# Patient Record
Sex: Female | Born: 1990 | Race: Black or African American | Hispanic: No | Marital: Married | Smoking: Never smoker
Health system: Southern US, Community
[De-identification: ages and names within clinical notes are randomized; demographics above are authoritative.]

## PROBLEM LIST (undated history)

## (undated) DIAGNOSIS — Z789 Other specified health status: Secondary | ICD-10-CM

## (undated) DIAGNOSIS — D649 Anemia, unspecified: Secondary | ICD-10-CM

## (undated) DIAGNOSIS — M1990 Unspecified osteoarthritis, unspecified site: Secondary | ICD-10-CM

## (undated) HISTORY — PX: KNEE ARTHROSCOPY W/ ACL RECONSTRUCTION: SHX1858

## (undated) HISTORY — PX: ARTHROSCOPIC REPAIR ACL: SUR80

## (undated) HISTORY — DX: Other specified health status: Z78.9

---

## 2009-07-04 ENCOUNTER — Emergency Department: Payer: Self-pay | Admitting: Emergency Medicine

## 2010-05-10 ENCOUNTER — Emergency Department: Payer: Self-pay | Admitting: Emergency Medicine

## 2012-04-13 ENCOUNTER — Inpatient Hospital Stay (HOSPITAL_COMMUNITY)
Admission: AD | Admit: 2012-04-13 | Discharge: 2012-04-13 | Disposition: A | Discharge status: Home / Self Care | Payer: Self-pay | Source: Ambulatory Visit | Attending: Family Medicine | Admitting: Family Medicine

## 2012-04-13 ENCOUNTER — Encounter (HOSPITAL_COMMUNITY): Payer: Self-pay | Admitting: Obstetrics and Gynecology

## 2012-04-13 DIAGNOSIS — Z3202 Encounter for pregnancy test, result negative: Secondary | ICD-10-CM | POA: Insufficient documentation

## 2012-04-13 LAB — URINALYSIS, ROUTINE W REFLEX MICROSCOPIC
Bilirubin Urine: NEGATIVE
Ketones, ur: NEGATIVE mg/dL
Nitrite: NEGATIVE
Protein, ur: NEGATIVE mg/dL
Urobilinogen, UA: 0.2 mg/dL (ref 0.0–1.0)

## 2012-04-13 NOTE — MAU Provider Note (Signed)
22 y.o. G0 here requesting pregnancy test. No period x 1.5 months. No pain or bleeding now.   Filed Vitals:   04/13/12 0928 04/13/12 0930  BP: 121/73   Pulse:  75  Temp: 98.2 F (36.8 C)   TempSrc: Oral   Resp: 18    Gen: well, no distress  Results for orders placed during the hospital encounter of 04/13/12 (from the past 24 hour(s))  URINALYSIS, ROUTINE W REFLEX MICROSCOPIC     Status: None   Collection Time    04/13/12  9:30 AM      Result Value Range   Color, Urine YELLOW  YELLOW   APPearance CLEAR  CLEAR   Specific Gravity, Urine 1.020  1.005 - 1.030   pH 7.5  5.0 - 8.0   Glucose, UA NEGATIVE  NEGATIVE mg/dL   Hgb urine dipstick NEGATIVE  NEGATIVE   Bilirubin Urine NEGATIVE  NEGATIVE   Ketones, ur NEGATIVE  NEGATIVE mg/dL   Protein, ur NEGATIVE  NEGATIVE mg/dL   Urobilinogen, UA 0.2  0.0 - 1.0 mg/dL   Nitrite NEGATIVE  NEGATIVE   Leukocytes, UA NEGATIVE  NEGATIVE  POCT PREGNANCY, URINE     Status: None   Collection Time    04/13/12  9:40 AM      Result Value Range   Preg Test, Ur NEGATIVE  NEGATIVE   A/P: Negative UPT Encouraged follow up in GYN clinic if no menses x 3 months

## 2012-04-13 NOTE — MAU Note (Signed)
Brenda Mcneil is here today for a pregnancy test. She is not having pain, bleeding or discharge. Her LMP was the end of January and no cycle since. She normally has regular cycles. She has taken 4 pregnancy tests at home and they were all negative.

## 2012-04-13 NOTE — MAU Provider Note (Signed)
Chart reviewed and agree with management and plan.  

## 2012-04-29 ENCOUNTER — Other Ambulatory Visit: Payer: Self-pay

## 2012-07-10 ENCOUNTER — Ambulatory Visit: Payer: Self-pay | Admitting: Primary Care

## 2012-08-08 ENCOUNTER — Ambulatory Visit: Payer: Self-pay | Admitting: Primary Care

## 2012-10-30 IMAGING — CR RIGHT THUMB 2+V
1 series · 3 of 3 positions shown · non-contrast
Comparison: none

REASON FOR EXAM: painful, shut in door
COMMENTS:

PROCEDURE:     DXR - DXR THUMB RIGHT HAND (1ST DIGIT)  - May 10, 2010  [DATE]
RESULT:     Three views of the right thumb are submitted. The bones are
adequately mineralized. I do not see evidence of an acute fracture nor
dislocation. No foreign bodies in the soft tissues are identified.

[Series 1: view not recorded · 0.17mm/px · 3 of 3 slices shown]
[im 1/3]
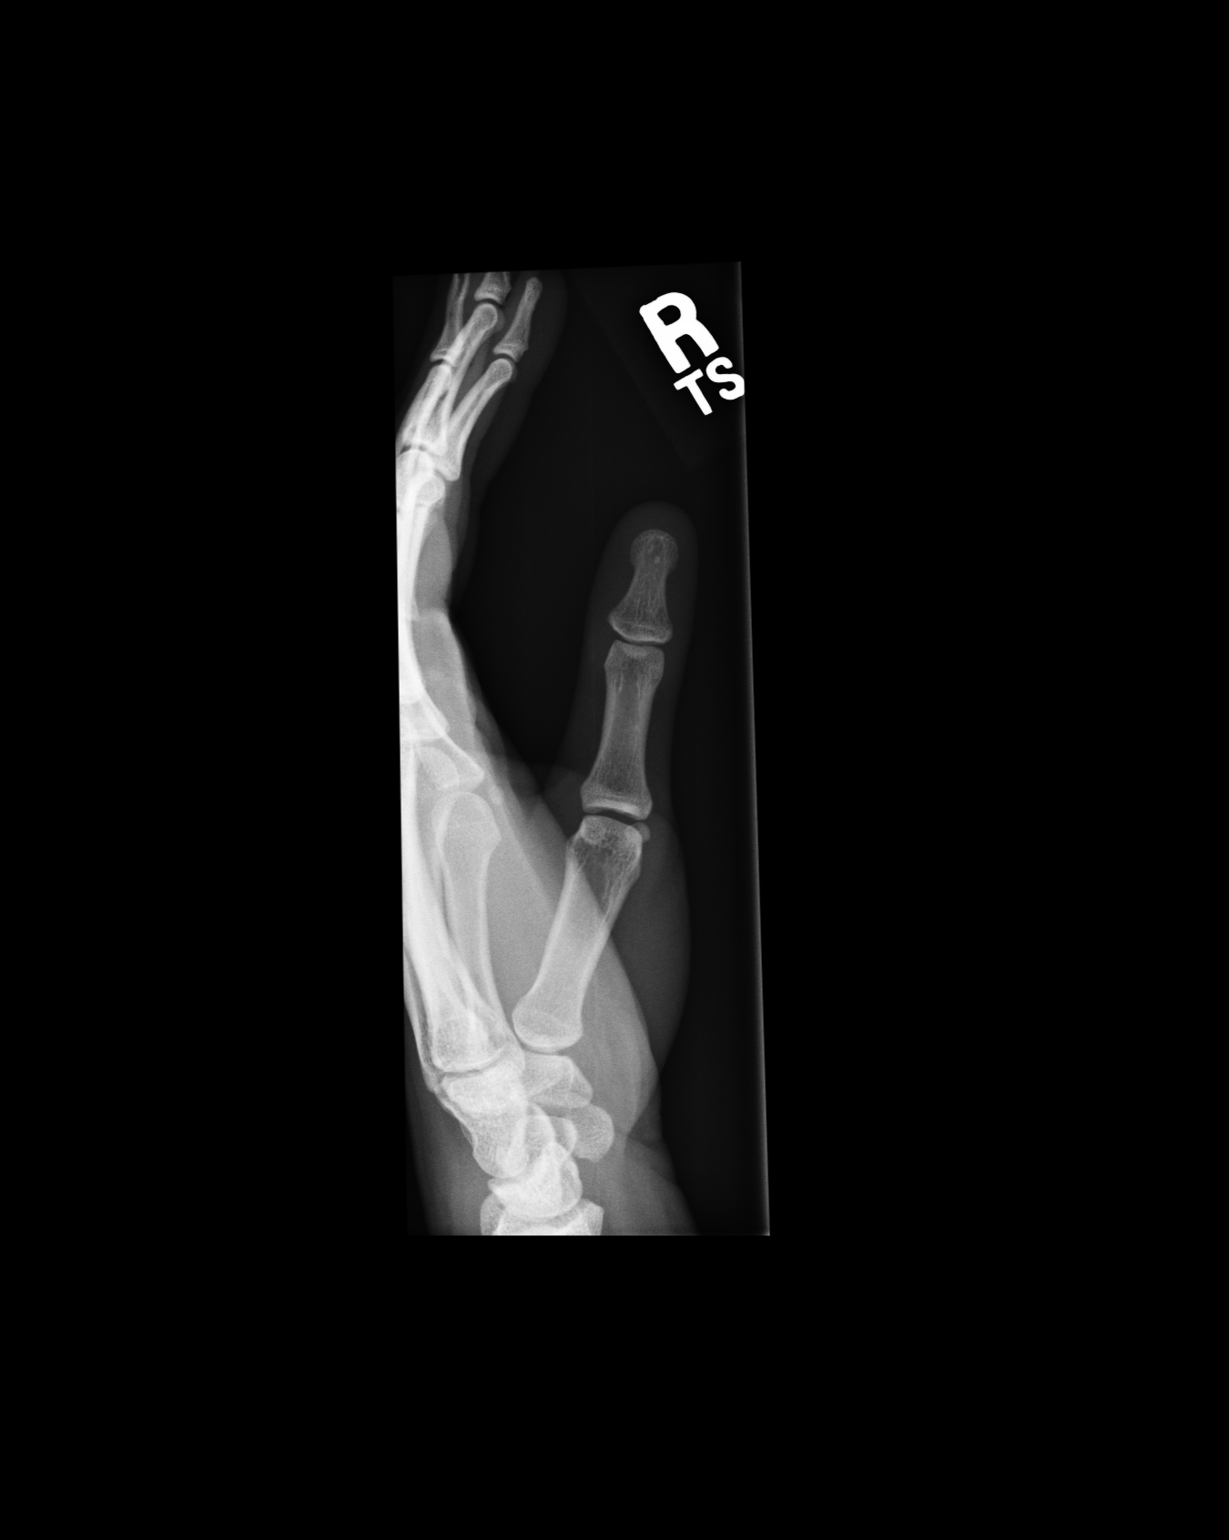
[im 2/3]
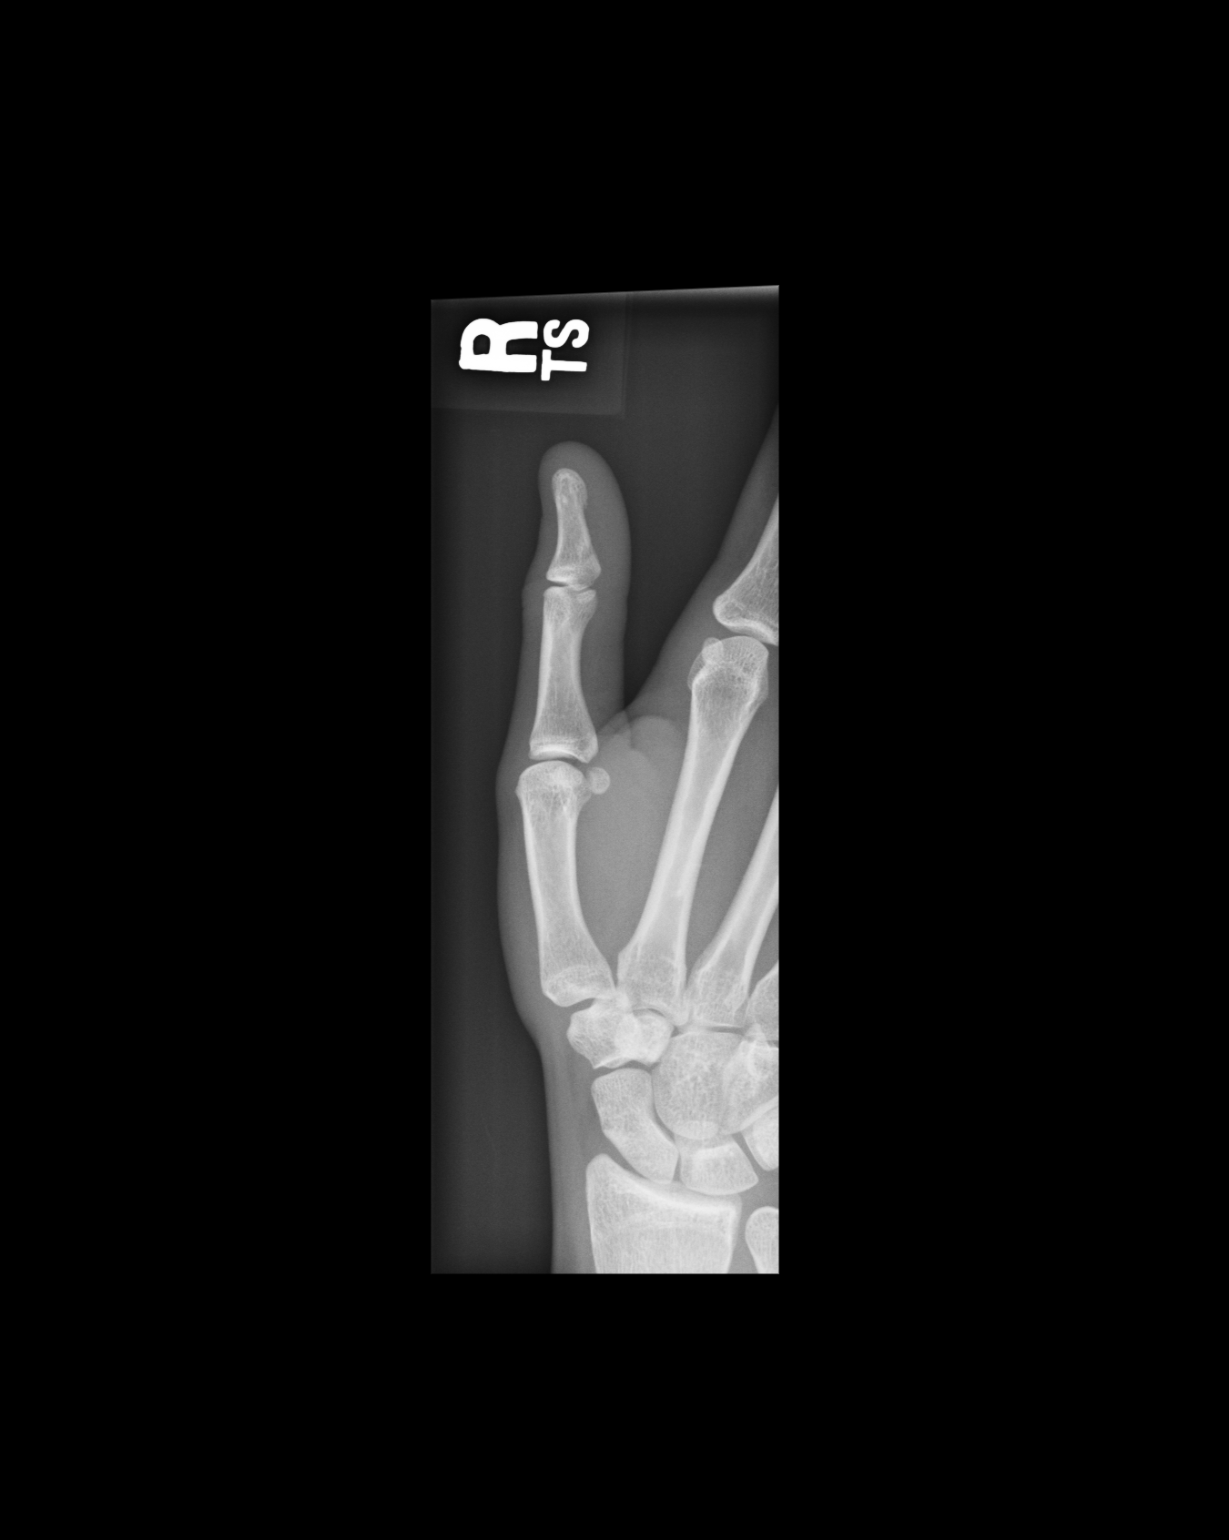
[im 3/3]
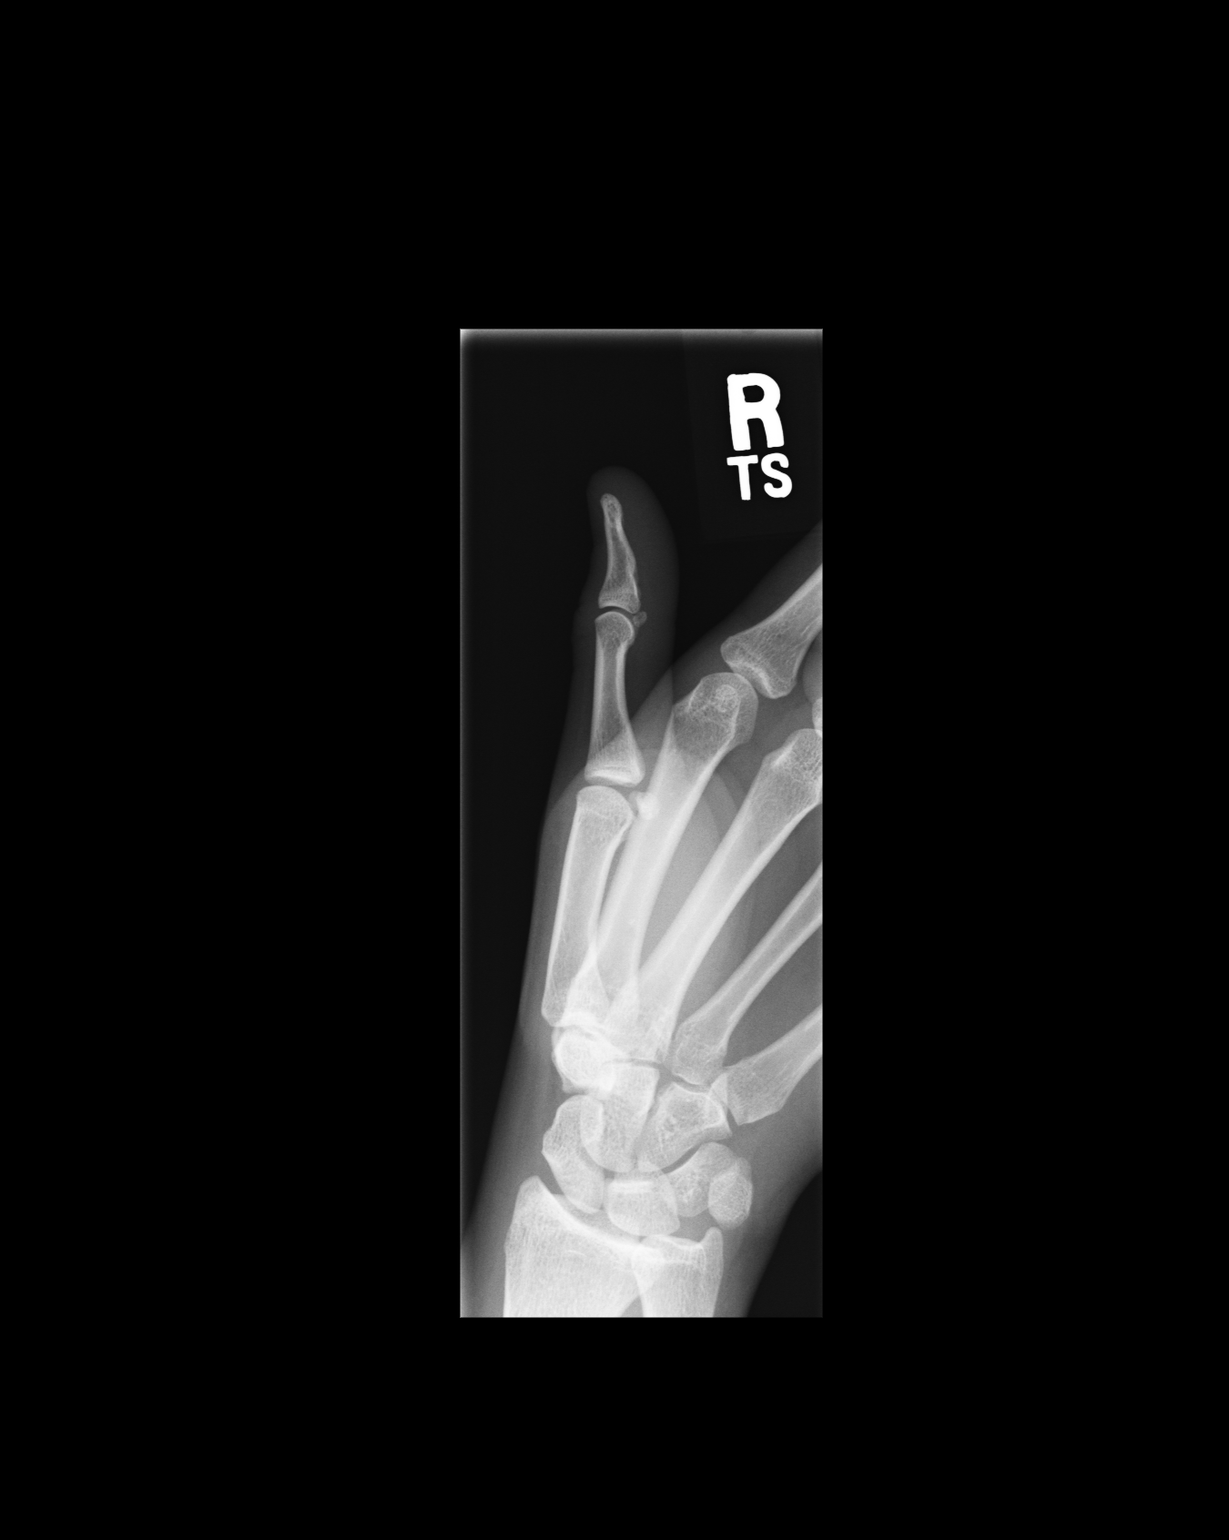

[3 of 3 positions shown; findings below may reference images not displayed]

IMPRESSION: I see no acute bony abnormality of the right thumb. .

## 2012-11-20 ENCOUNTER — Observation Stay: Payer: Self-pay

## 2014-01-01 ENCOUNTER — Encounter (HOSPITAL_COMMUNITY): Payer: Self-pay | Admitting: Emergency Medicine

## 2014-01-01 ENCOUNTER — Emergency Department (HOSPITAL_COMMUNITY)
Admission: EM | Admit: 2014-01-01 | Discharge: 2014-01-01 | Disposition: A | Discharge status: Home / Self Care | Payer: Self-pay | Attending: Emergency Medicine | Admitting: Emergency Medicine

## 2014-01-01 DIAGNOSIS — Y998 Other external cause status: Secondary | ICD-10-CM | POA: Insufficient documentation

## 2014-01-01 DIAGNOSIS — Y92410 Unspecified street and highway as the place of occurrence of the external cause: Secondary | ICD-10-CM | POA: Insufficient documentation

## 2014-01-01 DIAGNOSIS — Y9389 Activity, other specified: Secondary | ICD-10-CM | POA: Insufficient documentation

## 2014-01-01 DIAGNOSIS — S60812A Abrasion of left wrist, initial encounter: Secondary | ICD-10-CM | POA: Insufficient documentation

## 2014-01-01 MED ORDER — ACETAMINOPHEN 500 MG PO TABS
1000.0000 mg | ORAL_TABLET | Freq: Once | ORAL | Status: AC
  Administered 2014-01-01: 1000 mg via ORAL
  Filled 2014-01-01: qty 2
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
## 2014-12-31 IMAGING — US US OB US >=[ID] SNGL FETUS
1 series · 13 of 28 positions shown · non-contrast
Comparison: none

REASON FOR EXAM: 17 weeks  anatomy
COMMENTS:

[Series 1: us ob us >=(id) sngl fetus · 0.26mm/px · 13 of 70 slices shown]
[im 3/70]
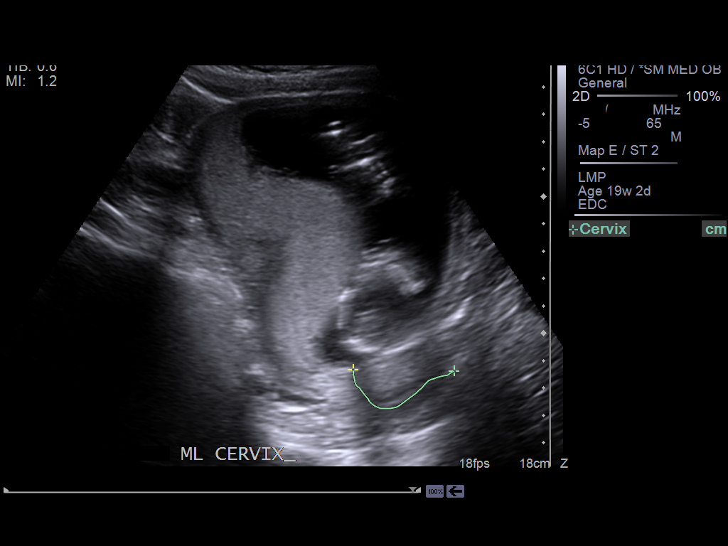
[im 8/70]
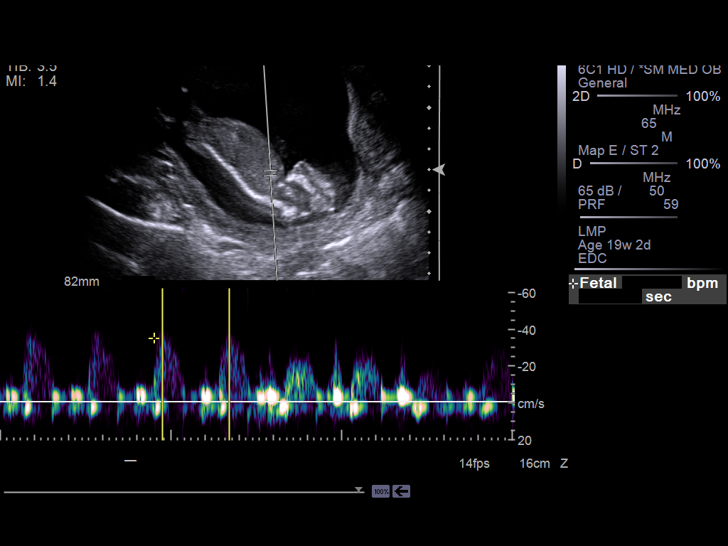
[im 13/70]
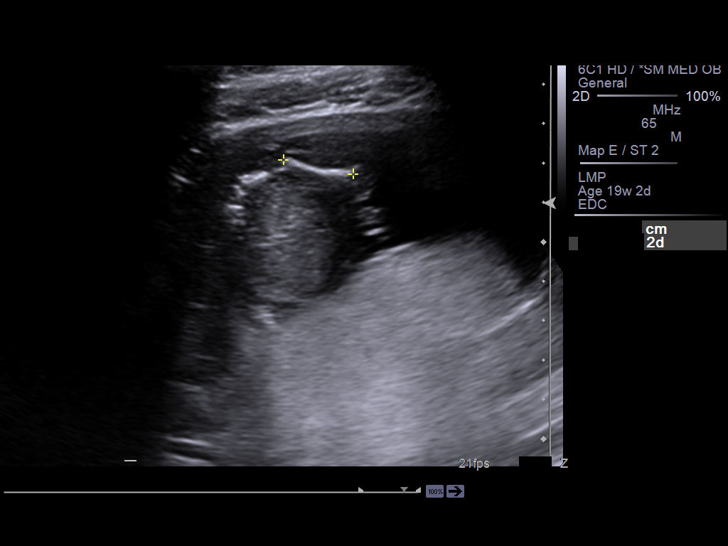
[im 18/70]
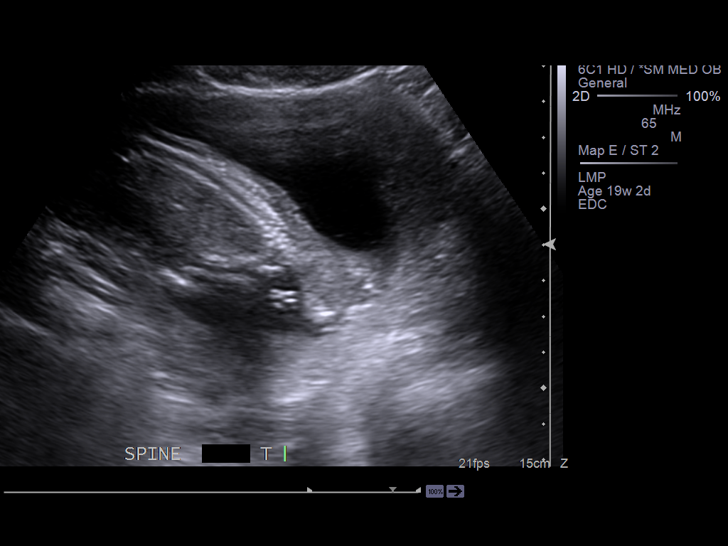
[im 24/70]
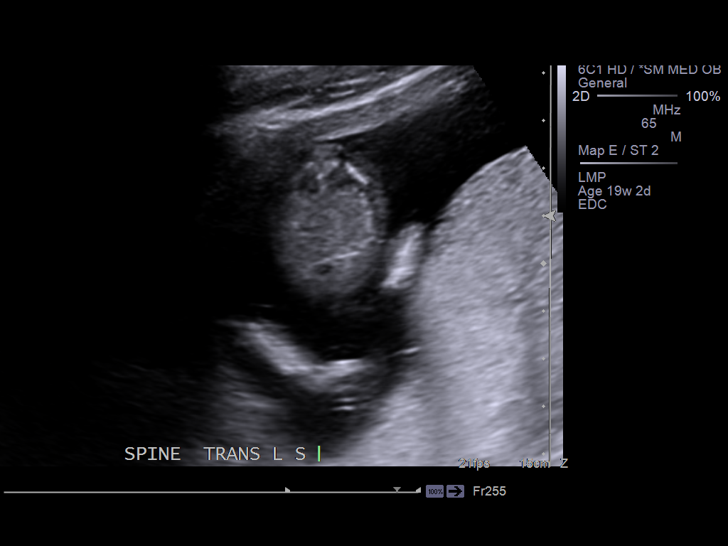
[im 29/70]
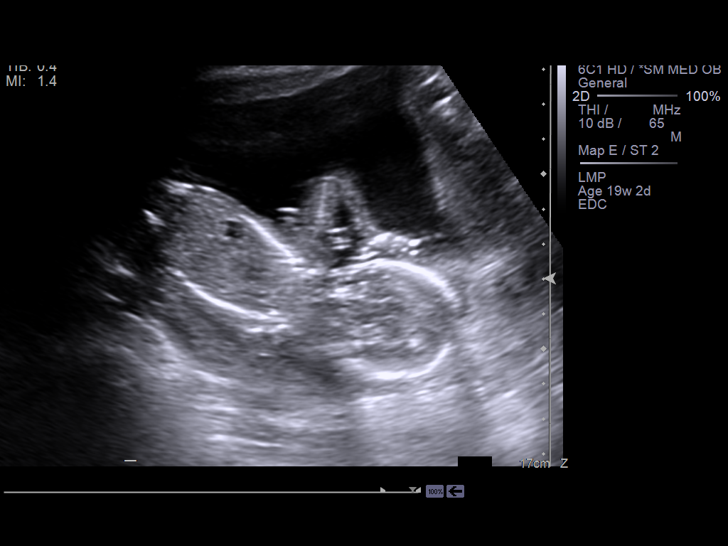
[im 36/70]
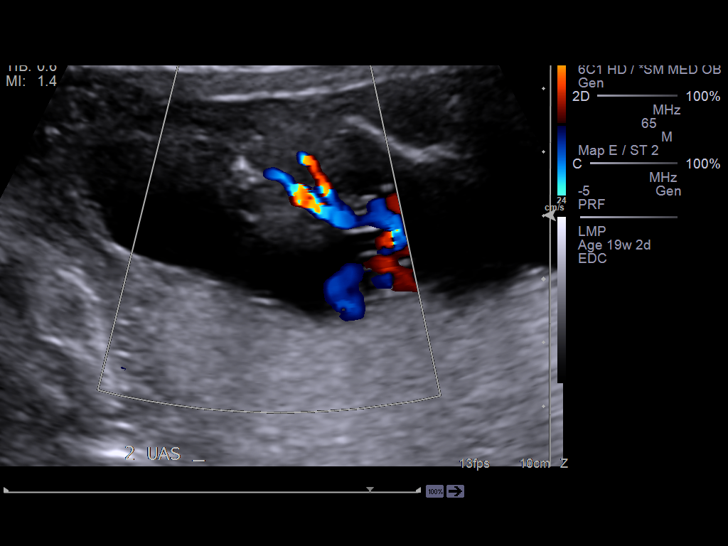
[im 41/70]
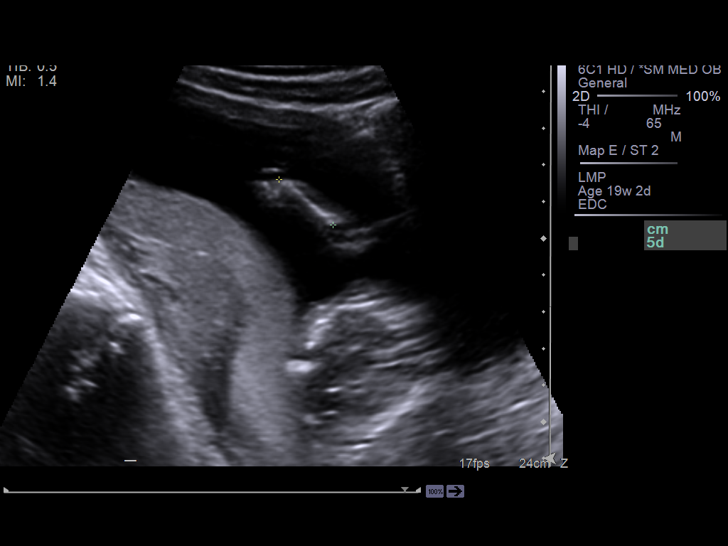
[im 47/70]
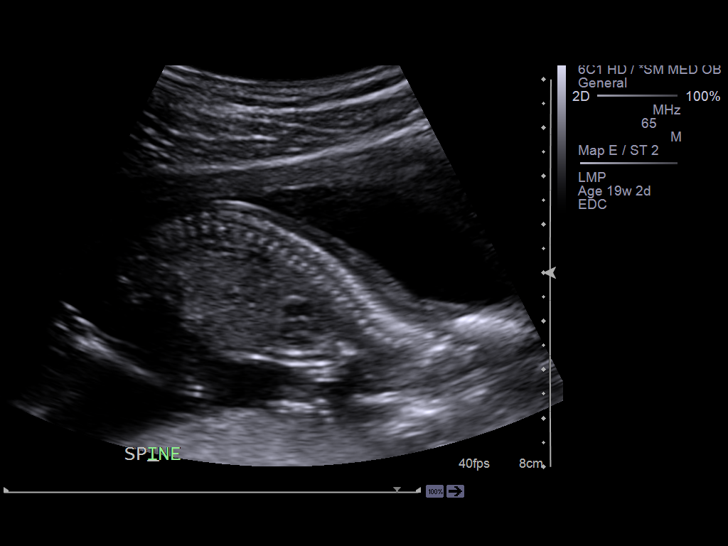
[im 52/70]
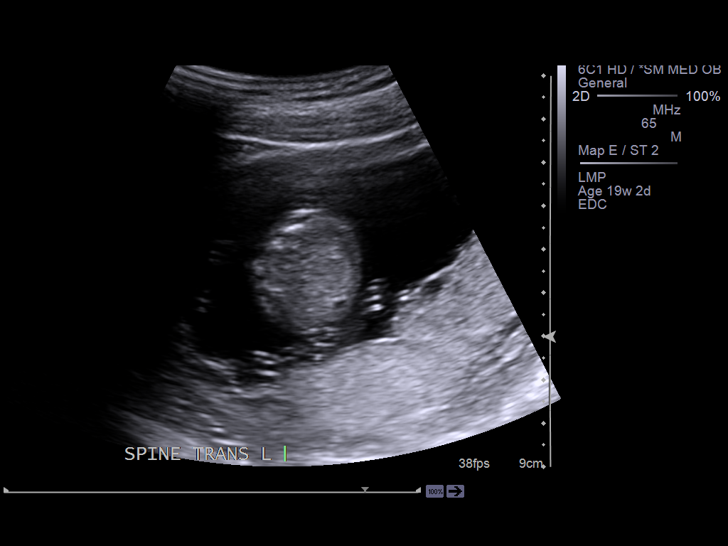
[im 57/70]
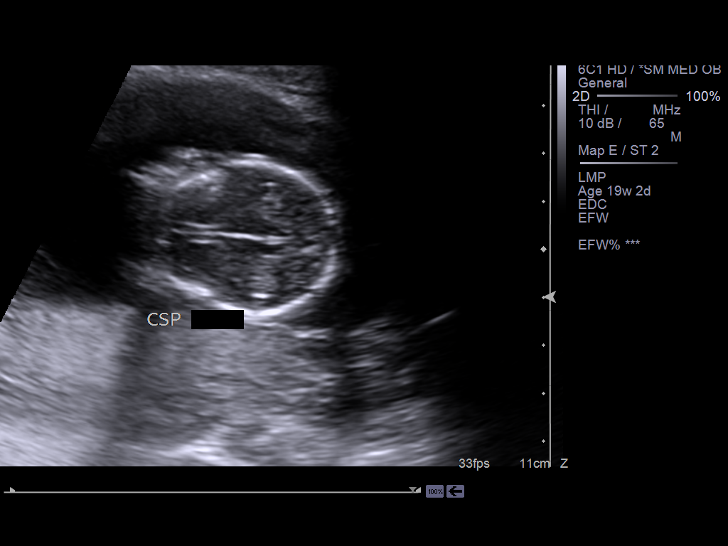
[im 62/70]
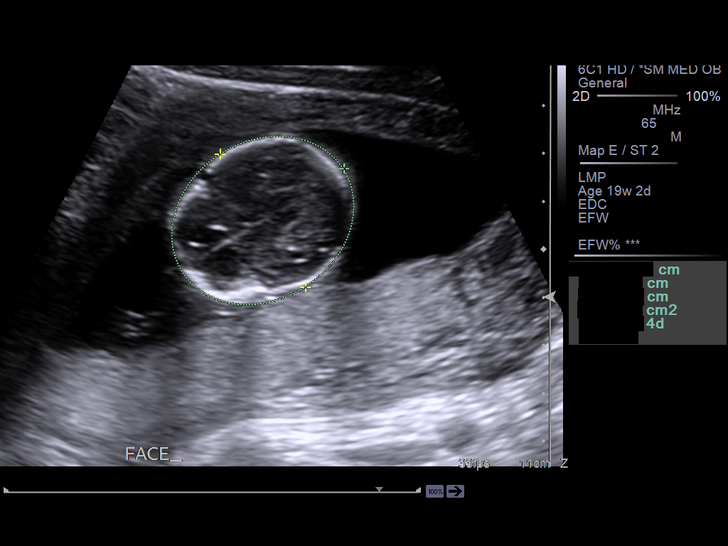
[im 67/70]
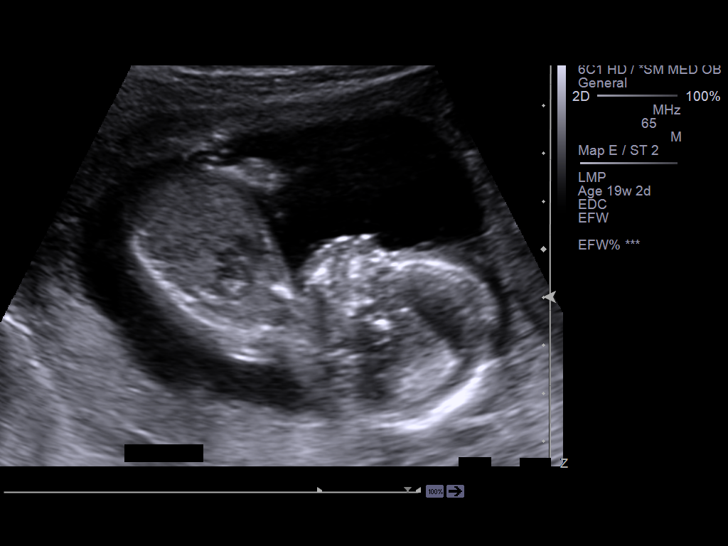

[13 of 28 positions shown; findings below may reference images not displayed]

PROCEDURE:     US  - US OB GREATER/OR EQUAL TO [id number]  - July 10, 2012  [DATE]

RESULT:     There is a single viable IUP with cephalic presentation. The
amniotic fluid volume is estimated to be normal. The placenta is posterior.
The distance from the lower placental segment to the internal cervical os is
2.7 cm. The intracranial structures, the craniocervical junction, and the
spinal structures are grossly normal. The fetal stomach and urinary bladder
are demonstrated and appear normal. A cardiac rate of 153 bpm was
demonstrated. The cardiac structure appears to be 4 chambered.

Measured parameters:
BPD 3.06 cm corresponding to an EGA of 15 weeks 5 days
HC 11.67 cm corresponding to an EGA of 15 weeks 5 days
AC 9.9 cm corresponding to an EGA of 15.6 cm
Epidural 1.8 cm corresponding to an EGA of 15 weeks 2 days
HL 1.9 cm corresponding to an EGA of 15 weeks 4 days.
IMPRESSION: 1. There is a viable IUP with estimated gestational age of 15 weeks 4 days
plus or minus approximately 12 days. The estimated date of confinement is 28 December, 2012. The fetal anatomy demonstrated exhibits no definite
anomalies. Evaluation of the fetal anatomy overall was limited due to the
young age.
2. The lower uterine segment is somewhat low lying. A followup study in
approximately 20 to 23 weeks is recommended.

[REDACTED]

## 2015-01-29 IMAGING — US US OB US >=[ID] SNGL FETUS
1 series · 13 of 28 positions shown · non-contrast
Comparison: none

REASON FOR EXAM: ANATOMY
COMMENTS:

[Series 1: us ob us >=(id) sngl fetus · 0.25mm/px · 13 of 85 slices shown]
[im 4/85]
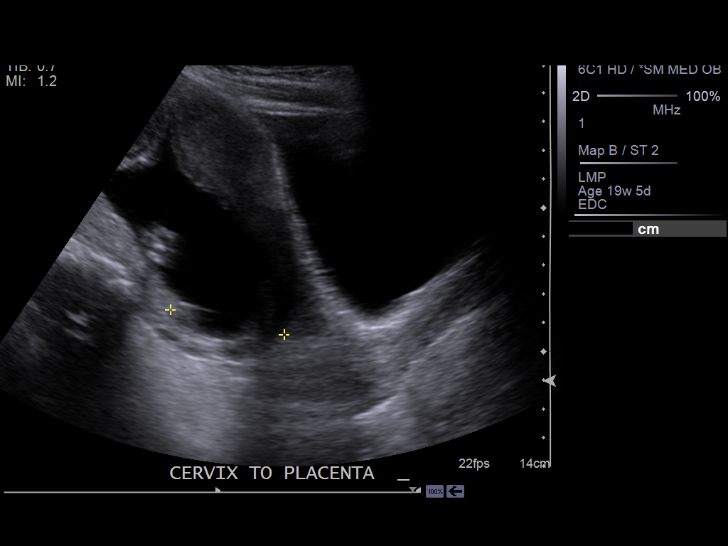
[im 10/85]
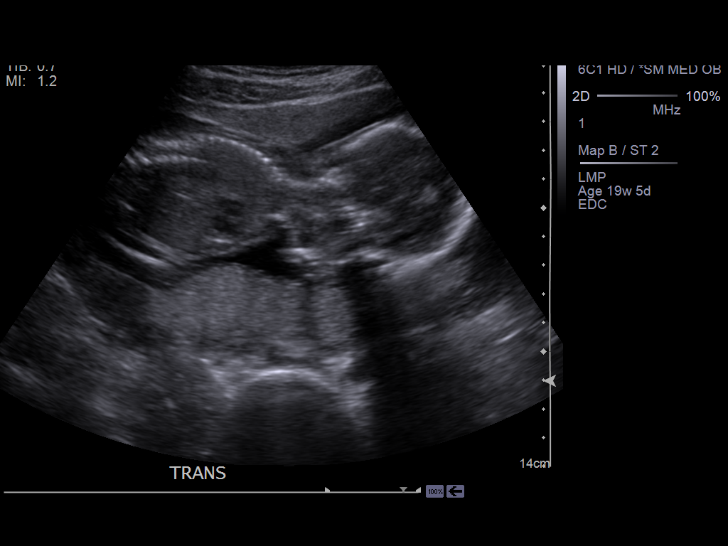
[im 16/85]
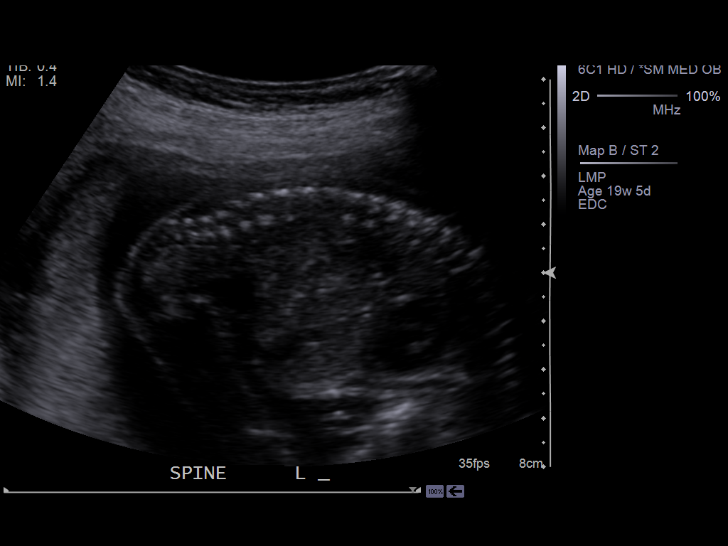
[im 22/85]
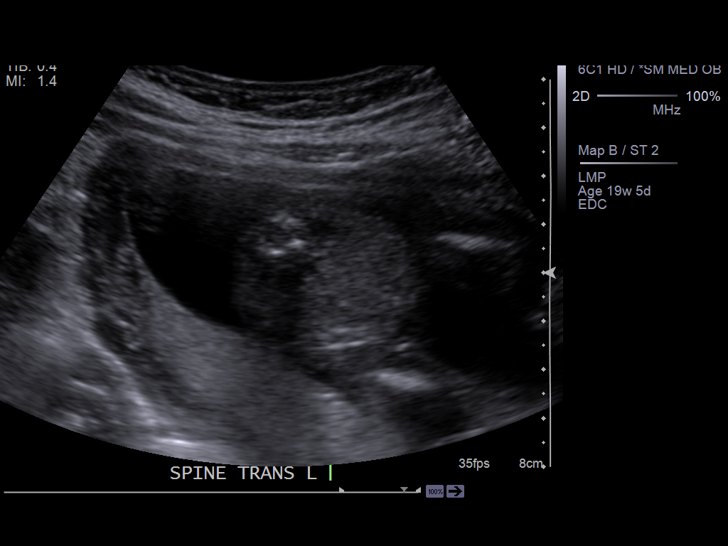
[im 29/85]
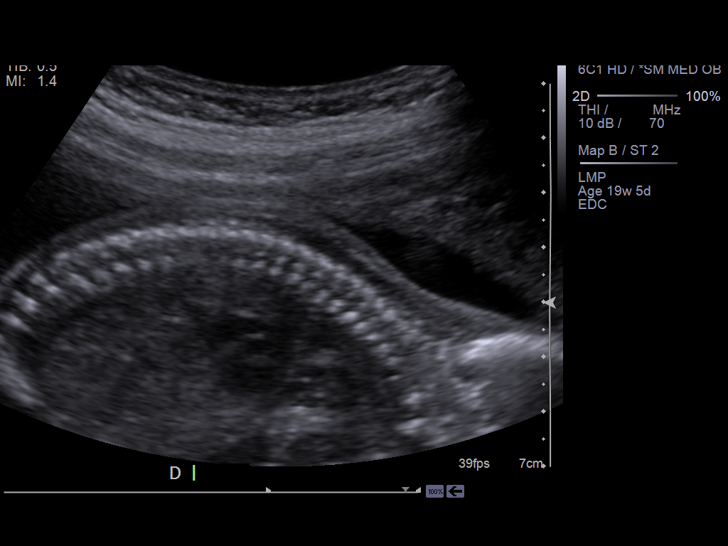
[im 35/85]
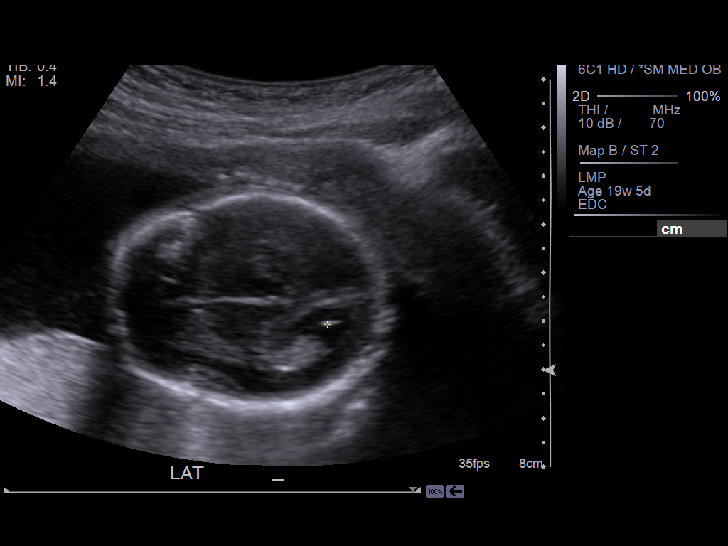
[im 44/85]
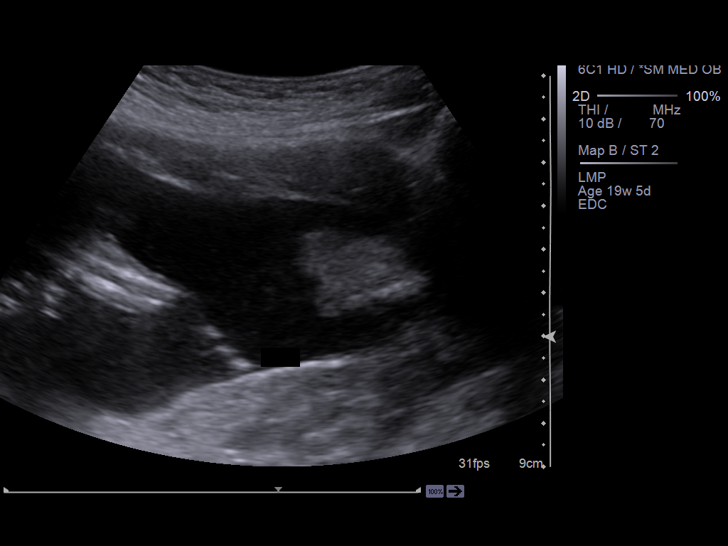
[im 50/85]
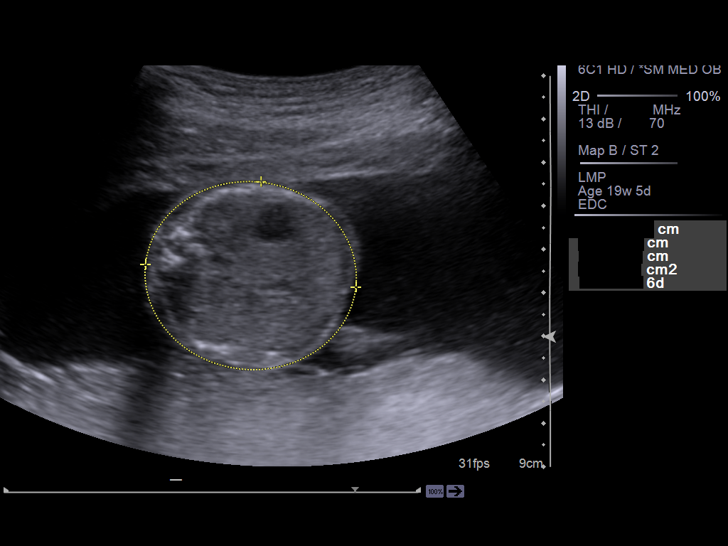
[im 57/85]
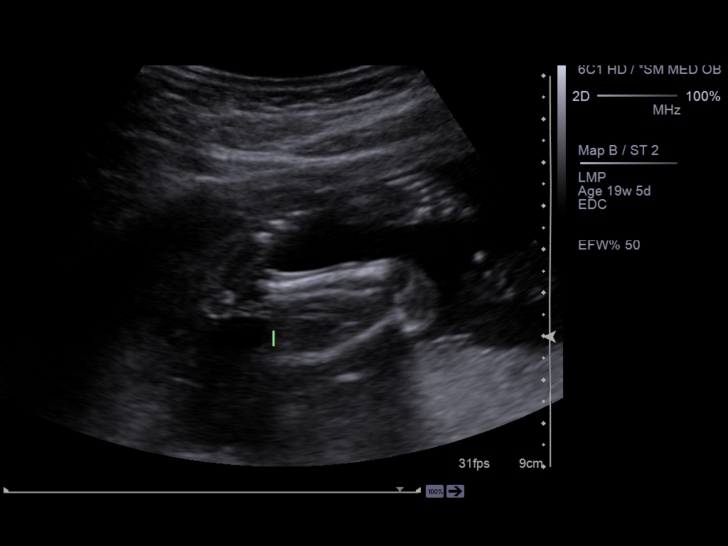
[im 63/85]
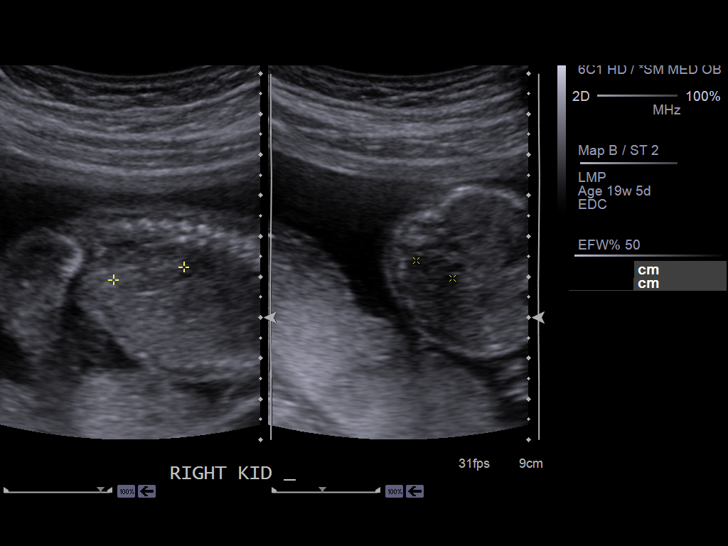
[im 69/85]
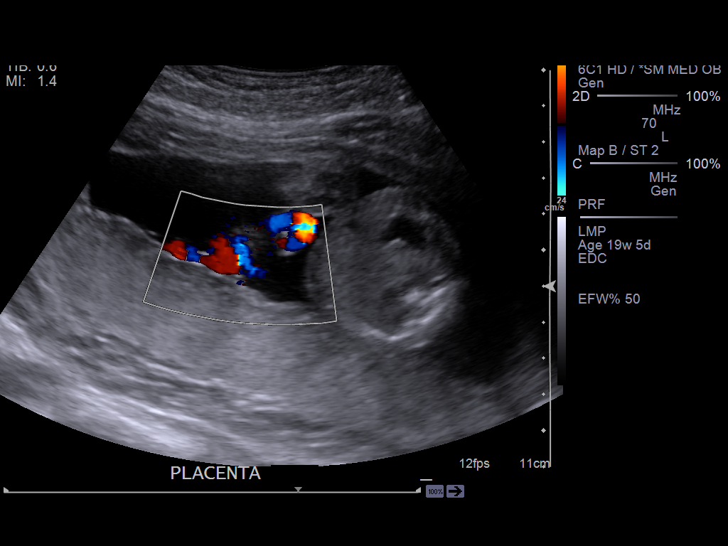
[im 75/85]
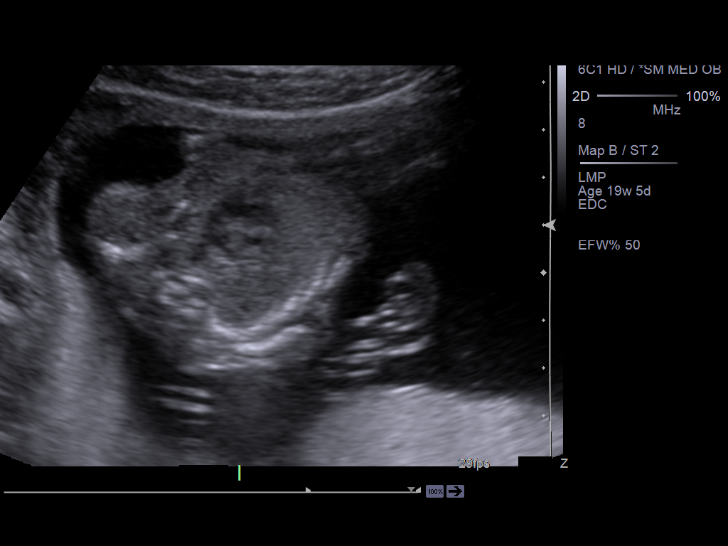
[im 81/85]
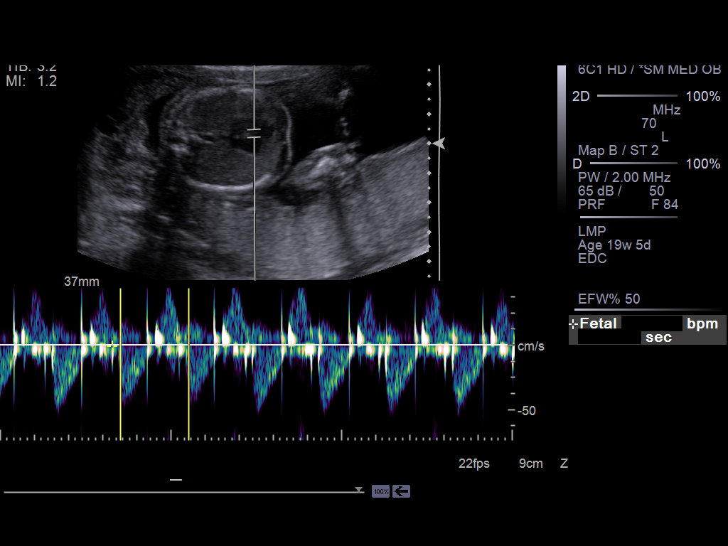

[13 of 28 positions shown; findings below may reference images not displayed]

PROCEDURE:     US  - US OB GREATER/OR EQUAL TO [id number]  - August 08, 2012  [DATE]

RESULT:     Elbow sonogram utilizing and OB protocol demonstrates the cervix
is closed and demonstrates a length of 3.87 cm. The placenta is posterior
and slightly to the right in location. The distance from the lower margin of
the placenta to the internal cervical os is measured as 4.05 cm. There is a
single intrauterine fetus in a transverse lie initially becoming cephalic
toward the end of the exam. Fetal cardiac, trunk, extremity and
diaphragmatic motion was observed by the technologist during the scan. The
fetal heart rate is demonstrated at 150 beats per minute. The amniotic fluid
volume is visually normal. Fetal anatomic assessment shows no focal
abnormality. There is a three-vessel umbilical cord with 2 umbilical
arteries demonstrated. No abnormal fluid collections are seen. Gestational
age based on fetal measurements is 19 weeks 3 days with a current estimated
fetal weight of 313 grams + / - 46 grams which is within the 50th percentile
for weight in the gestational age group. This indicates appropriate interval
growth since the previous
IMPRESSION: 1. Single intrauterine gestation approximately 19 weeks 3 days with an
ultrasound EDC of 12/30/2012 is demonstrated as described.

[REDACTED]

## 2015-05-15 ENCOUNTER — Encounter: Payer: Self-pay | Admitting: Urgent Care

## 2015-05-15 DIAGNOSIS — Z5321 Procedure and treatment not carried out due to patient leaving prior to being seen by health care provider: Secondary | ICD-10-CM | POA: Insufficient documentation

## 2015-05-15 LAB — LIPASE, BLOOD: Lipase: 21 U/L (ref 11–51)

## 2015-05-15 LAB — COMPREHENSIVE METABOLIC PANEL
ALK PHOS: 59 U/L (ref 38–126)
ALT: 18 U/L (ref 14–54)
AST: 29 U/L (ref 15–41)
Albumin: 4.5 g/dL (ref 3.5–5.0)
Anion gap: 6 (ref 5–15)
BUN: 17 mg/dL (ref 6–20)
CALCIUM: 8.8 mg/dL — AB (ref 8.9–10.3)
CHLORIDE: 107 mmol/L (ref 101–111)
CO2: 25 mmol/L (ref 22–32)
CREATININE: 0.82 mg/dL (ref 0.44–1.00)
Glucose, Bld: 157 mg/dL — ABNORMAL HIGH (ref 65–99)
Potassium: 4.3 mmol/L (ref 3.5–5.1)
SODIUM: 138 mmol/L (ref 135–145)
Total Bilirubin: 0.8 mg/dL (ref 0.3–1.2)
Total Protein: 8.6 g/dL — ABNORMAL HIGH (ref 6.5–8.1)

## 2015-05-15 LAB — CBC
HCT: 40.3 % (ref 35.0–47.0)
Hemoglobin: 13.5 g/dL (ref 12.0–16.0)
MCH: 28.6 pg (ref 26.0–34.0)
MCHC: 33.6 g/dL (ref 32.0–36.0)
MCV: 85 fL (ref 80.0–100.0)
PLATELETS: 170 10*3/uL (ref 150–440)
RBC: 4.74 MIL/uL (ref 3.80–5.20)
RDW: 12.8 % (ref 11.5–14.5)
WBC: 11.1 10*3/uL — ABNORMAL HIGH (ref 3.6–11.0)

## 2015-05-15 MED ORDER — ONDANSETRON 4 MG PO TBDP
ORAL_TABLET | ORAL | Status: AC
  Administered 2015-05-15: 4 mg via ORAL
  Filled 2015-05-15: qty 1

## 2015-05-15 MED ORDER — ONDANSETRON 4 MG PO TBDP: 4.0000 mg | ORAL_TABLET | Freq: Once | ORAL | Status: AC | PRN

## 2015-05-15 NOTE — ED Notes (Signed)
Patient unable to urinate at this time. Cup provided and patient asked to provide sample when she is able to.

## 2015-05-15 NOTE — ED Notes (Signed)
Patient presents with c/o N/V/D since around 1800 tonight. Denies abdominal pain and urinary symptoms.

## 2015-05-16 ENCOUNTER — Emergency Department
Admission: EM | Admit: 2015-05-16 | Discharge: 2015-05-16 | Disposition: A | Discharge status: Home / Self Care | Payer: No Typology Code available for payment source | Attending: Emergency Medicine | Admitting: Emergency Medicine

## 2015-05-17 ENCOUNTER — Telehealth: Payer: Self-pay | Admitting: Emergency Medicine

## 2015-05-17 NOTE — ED Notes (Signed)
Called patient due to lwot to inquire about condition and follow up plans. Left message.   

## 2017-05-03 ENCOUNTER — Encounter: Payer: Self-pay | Admitting: Emergency Medicine

## 2017-05-03 DIAGNOSIS — L0291 Cutaneous abscess, unspecified: Secondary | ICD-10-CM | POA: Insufficient documentation

## 2017-05-03 LAB — GROUP A STREP BY PCR: Group A Strep by PCR: NOT DETECTED

## 2017-05-03 MED ORDER — ACETAMINOPHEN 325 MG PO TABS
650.0000 mg | ORAL_TABLET | Freq: Once | ORAL | Status: AC | PRN
  Administered 2017-05-03: 650 mg via ORAL

## 2017-05-03 MED ORDER — ACETAMINOPHEN 325 MG PO TABS
ORAL_TABLET | ORAL | Status: AC
  Filled 2017-05-03: qty 2

## 2017-05-03 NOTE — ED Triage Notes (Signed)
Pt comes into the ED via POV c/o rash, sore throat and body aches that started a couple of days ago.  Fevers at home with the highest being 101.  Patient has even and unlabored respirations at this time and in NAD.  Denies any cough or congestion.

## 2017-05-04 ENCOUNTER — Emergency Department
Admission: EM | Admit: 2017-05-04 | Discharge: 2017-05-04 | Disposition: A | Discharge status: Home / Self Care | Payer: Self-pay | Attending: Emergency Medicine | Admitting: Emergency Medicine

## 2017-05-04 DIAGNOSIS — L0291 Cutaneous abscess, unspecified: Secondary | ICD-10-CM

## 2017-05-04 LAB — COMPREHENSIVE METABOLIC PANEL
ALK PHOS: 69 U/L (ref 38–126)
ALT: 12 U/L — AB (ref 14–54)
ANION GAP: 10 (ref 5–15)
AST: 21 U/L (ref 15–41)
Albumin: 3.5 g/dL (ref 3.5–5.0)
BUN: 11 mg/dL (ref 6–20)
CHLORIDE: 101 mmol/L (ref 101–111)
CO2: 25 mmol/L (ref 22–32)
Calcium: 8.6 mg/dL — ABNORMAL LOW (ref 8.9–10.3)
Creatinine, Ser: 0.76 mg/dL (ref 0.44–1.00)
GFR calc non Af Amer: >60 mL/min (ref >60)
GLUCOSE: 99 mg/dL (ref 65–99)
Potassium: 3.9 mmol/L (ref 3.5–5.1)
Sodium: 136 mmol/L (ref 135–145)
Total Bilirubin: 0.8 mg/dL (ref 0.3–1.2)
Total Protein: 7.5 g/dL (ref 6.5–8.1)

## 2017-05-04 LAB — CBC
HEMATOCRIT: 35.5 % (ref 35.0–47.0)
Hemoglobin: 11.9 g/dL — ABNORMAL LOW (ref 12.0–16.0)
MCH: 27.8 pg (ref 26.0–34.0)
MCHC: 33.6 g/dL (ref 32.0–36.0)
MCV: 82.7 fL (ref 80.0–100.0)
Platelets: 181 10*3/uL (ref 150–440)
RBC: 4.29 MIL/uL (ref 3.80–5.20)
RDW: 12.5 % (ref 11.5–14.5)
WBC: 8.2 10*3/uL (ref 3.6–11.0)

## 2017-05-04 LAB — URINALYSIS, COMPLETE (UACMP) WITH MICROSCOPIC
BILIRUBIN URINE: NEGATIVE
GLUCOSE, UA: NEGATIVE mg/dL
HGB URINE DIPSTICK: NEGATIVE
KETONES UR: 20 mg/dL — AB
NITRITE: NEGATIVE
PROTEIN: NEGATIVE mg/dL
Specific Gravity, Urine: 1.008 (ref 1.005–1.030)
pH: 6 (ref 5.0–8.0)

## 2017-05-04 MED ORDER — SULFAMETHOXAZOLE-TRIMETHOPRIM 800-160 MG PO TABS: 1.0000 | ORAL_TABLET | Freq: Two times a day (BID) | ORAL | 0 refills | Status: AC

## 2017-05-04 MED ORDER — SULFAMETHOXAZOLE-TRIMETHOPRIM 800-160 MG PO TABS
1.0000 | ORAL_TABLET | Freq: Once | ORAL | Status: AC
  Administered 2017-05-04: 1 via ORAL
  Filled 2017-05-04: qty 1

## 2017-05-04 NOTE — ED Notes (Signed)
This RN and MD Manson PasseyBrown to bedside

## 2017-05-05 NOTE — ED Provider Notes (Signed)
[facility] Emergency Department Provider Note    First MD Initiated Contact with Patient 05/04/17 0308     (approximate)  I have reviewed the triage vital signs and the nursing notes.   HISTORY  Chief Complaint Generalized Body Aches and Rash    HPI Brenda Mcneil is a 27 y.o. female resents to the emergency department with bilateral upper extremity rash with tender area in the right axilla.  Patient also admits to generalized body aches and sore throat.  Patient states that symptoms began 2 days ago.  Patient states that she has had a fever at home which was 101.  Past medical history None There are no active problems to display for this patient.   Past Surgical History:  Procedure Laterality Date  . KNEE ARTHROSCOPY W/ ACL RECONSTRUCTION      Prior to Admission medications   Medication Sig Start Date End Date Taking? Authorizing Provider  sulfamethoxazole-trimethoprim (BACTRIM DS,SEPTRA DS) 800-160 MG tablet Take 1 tablet by mouth 2 (two) times daily for 10 days. 05/04/17 05/14/17  Darci CurrentBrown, [place] N, MD    Allergies Vicodin [hydrocodone-acetaminophen]  No family history on file.  Social History Social History   Tobacco Use  . Smoking status: Never Smoker  . Smokeless tobacco: Never Used  Substance Use Topics  . Alcohol use: No  . Drug use: No    Review of Systems Constitutional: Positive for fever Eyes: No visual changes. ENT: No sore throat. Cardiovascular: Denies chest pain. Respiratory: Denies shortness of breath. Gastrointestinal: No abdominal pain.  No nausea, no vomiting.  No diarrhea.  No constipation. Genitourinary: Negative for dysuria. Musculoskeletal: Negative for neck pain.  Negative for back pain. Integumentary: Positive for rash. Neurological: Negative for headaches, focal weakness or numbness.   ____________________________________________   PHYSICAL EXAM:  VITAL SIGNS: ED Triage Vitals  Enc Vitals Group   BP 05/03/17 2242 139/86     Pulse Rate 05/03/17 2242 (!) 113     Resp 05/04/17 0312 14     Temp 05/03/17 2242 (!) 101.2 F (38.4 C)     Temp Source 05/03/17 2242 Oral     SpO2 05/03/17 2242 100 %     Weight 05/03/17 2242 68 kg (150 lb)     Height 05/03/17 2242 1.727 m (5\' 8" )     Head Circumference --      Peak Flow --      Pain Score 05/03/17 2242 7     Pain Loc --      Pain Edu? --      Excl. in GC? --     Constitutional: Alert and oriented. Well appearing and in no acute distress. Eyes: Conjunctivae are normal.  Head: Atraumatic. Mouth/Throat: Mucous membranes are moist.  Oropharynx non-erythematous. Neck: No stridor.   Cardiovascular: Normal rate, regular rhythm. Good peripheral circulation. Grossly normal heart sounds. Respiratory: Normal respiratory effort.  No retractions. Lungs CTAB. Gastrointestinal: Soft and nontender. No distention.  Musculoskeletal: No lower extremity tenderness nor edema. No gross deformities of extremities. Neurologic:  Normal speech and language. No gross focal neurologic deficits are appreciated.  Skin: Distinct pustules noted bilateral upper extremity with tender pustule noted in the right axilla without any flocculence Psychiatric: Mood and affect are normal. Speech and behavior are normal.  ____________________________________________   LABS (all labs ordered are listed, but only abnormal results are displayed)  Labs Reviewed  CBC - Abnormal; Notable for the following components:      Result Value  Hemoglobin 11.9 (*)    All other components within normal limits  COMPREHENSIVE METABOLIC PANEL - Abnormal; Notable for the following components:   Calcium 8.6 (*)    ALT 12 (*)    All other components within normal limits  URINALYSIS, COMPLETE (UACMP) WITH MICROSCOPIC - Abnormal; Notable for the following components:   Color, Urine YELLOW (*)    APPearance HAZY (*)    Ketones, ur 20 (*)    Leukocytes, UA SMALL (*)    Bacteria, UA RARE  (*)    Squamous Epithelial / LPF 0-5 (*)    All other components within normal limits  GROUP A STREP BY PCR       Procedures   ____________________________________________   INITIAL IMPRESSION / ASSESSMENT AND PLAN / ED COURSE  As part of my medical decision making, I reviewed the following data within the electronic MEDICAL RECORD NUMBER  27 year old female presenting to the emergency department with above-stated history and physical exam.  Consider the possibility of strep pharyngitis however strep PCR negative.  Rash not consistent with scarlet fever.  Laboratory data unremarkable patient noted to have a potential evolving abscess in the axilla and as such Bactrim DS was administered.  Concern for MRSA infection given multiple pustules __________________________________  FINAL CLINICAL IMPRESSION(S) / ED DIAGNOSES  Final diagnoses:  Abscess     MEDICATIONS GIVEN DURING THIS VISIT:  Medications  acetaminophen (TYLENOL) tablet 650 mg (650 mg Oral Given 05/03/17 2248)  sulfamethoxazole-trimethoprim (BACTRIM DS,SEPTRA DS) 800-160 MG per tablet 1 tablet (1 tablet Oral Given 05/04/17 1610)     ED Discharge Orders        Ordered    sulfamethoxazole-trimethoprim (BACTRIM DS,SEPTRA DS) 800-160 MG tablet  2 times daily     05/04/17 9604       Note:  This document was prepared using Dragon voice recognition software and may include unintentional dictation errors.    Darci Current, MD 05/05/17 [phone]

## 2019-10-28 ENCOUNTER — Ambulatory Visit (INDEPENDENT_AMBULATORY_CARE_PROVIDER_SITE_OTHER): Payer: Self-pay | Admitting: Obstetrics and Gynecology

## 2019-10-28 ENCOUNTER — Other Ambulatory Visit (HOSPITAL_COMMUNITY)
Admission: RE | Admit: 2019-10-28 | Discharge: 2019-10-28 | Disposition: A | Discharge status: Home / Self Care | Payer: Self-pay | Source: Ambulatory Visit | Attending: Obstetrics and Gynecology | Admitting: Obstetrics and Gynecology

## 2019-10-28 ENCOUNTER — Encounter: Payer: Self-pay | Admitting: Obstetrics and Gynecology

## 2019-10-28 ENCOUNTER — Other Ambulatory Visit: Payer: Self-pay

## 2019-10-28 VITALS — BP 135/78 | HR 74 | Wt 165.2 lb

## 2019-10-28 DIAGNOSIS — Z3480 Encounter for supervision of other normal pregnancy, unspecified trimester: Secondary | ICD-10-CM | POA: Insufficient documentation

## 2019-10-28 MED ORDER — BLOOD PRESSURE CUFF MISC: 1.0000 | 0 refills | Status: DC

## 2019-10-28 NOTE — Progress Notes (Signed)
  Subjective:  Brenda Mcneil is a G2P1001 [redacted]w[redacted]d being seen today for her first obstetrical visit.  Her obstetrical history is significant for one normal vaginal delivery. Patient does intend to breast feed. Pregnancy history fully reviewed.  Patient reports no complaints.  BP 135/78   Pulse 74   Wt 165 lb 3.2 oz (74.9 kg)   LMP 07/30/2019   BMI 25.12 kg/m   HISTORY: OB History  Gravida Para Term Preterm AB Living  2 1 1  0 0 1  SAB TAB Ectopic Multiple Live Births  0 0 0 0 1    # Outcome Date GA Lbr Len/2nd Weight Sex Delivery Anes PTL Lv  2 Current           1 Term 01/05/13    F Vag-Spont   LIV    Past Medical History:  Diagnosis Date  . Medical history non-contributory     Past Surgical History:  Procedure Laterality Date  . ARTHROSCOPIC REPAIR ACL Left   . KNEE ARTHROSCOPY W/ ACL RECONSTRUCTION      History reviewed. No pertinent family history.   Exam  BP 135/78   Pulse 74   Wt 165 lb 3.2 oz (74.9 kg)   LMP 07/30/2019   BMI 25.12 kg/m   Chaperone present during exam  CONSTITUTIONAL: Well-developed, well-nourished female in no acute distress.  HENT:  Normocephalic, atraumatic, External right and left ear normal. Oropharynx is clear and moist EYES: Conjunctivae and EOM are normal. Pupils are equal, round, and reactive to light. No scleral icterus.  NECK: Normal range of motion, supple, no masses.  Normal thyroid.  CARDIOVASCULAR: Normal heart rate noted, regular rhythm RESPIRATORY: Clear to auscultation bilaterally. Effort and breath sounds normal, no problems with respiration noted. BREASTS: Symmetric in size. No masses, skin changes, nipple drainage, or lymphadenopathy. ABDOMEN: Soft, normal bowel sounds, no distention noted.  No tenderness, rebound or guarding.  PELVIC: Normal appearing external genitalia; normal appearing vaginal mucosa and cervix. No abnormal discharge noted. Normal uterine size, no other palpable masses, no uterine or adnexal  tenderness. MUSCULOSKELETAL: Normal range of motion. No tenderness.  No cyanosis, clubbing, or edema.  2+ distal pulses. SKIN: Skin is warm and dry. No rash noted. Not diaphoretic. No erythema. No pallor. NEUROLOGIC: Alert and oriented to person, place, and time. Normal reflexes, muscle tone coordination. No cranial nerve deficit noted. PSYCHIATRIC: Normal mood and affect. Normal behavior. Normal judgment and thought content.    Assessment:    Pregnancy: G2P1001 Patient Active Problem List   Diagnosis Date Noted  . Supervision of other normal pregnancy, antepartum 10/28/2019      Plan:   1. Supervision of other normal pregnancy, antepartum - Prenatal labs, genital cultures, and urine cultures obtained. - Patient counseled about genetic screening.  She desires testing.  Labs ordered. - Patient for OB ultrasound. - She will continue prenatal vitamins.   - Next appointment in 4 weeks will be virtual.       Problem list reviewed and updated. 50% of 40 min visit spent on counseling and coordination of care.     10/30/2019. Darrin Nipper, MD 10/28/2019

## 2019-10-28 NOTE — Progress Notes (Signed)
Pt is here for initial OB visit.   Pregnancy confirmed with [facility], UPT positive on 09/10/19. LMP 07/30/19, EDD 05/05/20.

## 2019-10-29 ENCOUNTER — Other Ambulatory Visit: Payer: Self-pay | Admitting: Obstetrics & Gynecology

## 2019-10-29 DIAGNOSIS — N760 Acute vaginitis: Secondary | ICD-10-CM

## 2019-10-29 LAB — CBC/D/PLT+RPR+RH+ABO+RUB AB...
Antibody Screen: NEGATIVE
Basophils Absolute: 0 10*3/uL (ref 0.0–0.2)
Basos: 0 %
EOS (ABSOLUTE): 0 10*3/uL (ref 0.0–0.4)
Eos: 1 %
HCV Ab: <0.1 s/co ratio (ref 0.0–0.9)
HIV Screen 4th Generation wRfx: NONREACTIVE
Hematocrit: 33.4 % — ABNORMAL LOW (ref 34.0–46.6)
Hemoglobin: 11.4 g/dL (ref 11.1–15.9)
Hepatitis B Surface Ag: NEGATIVE
Immature Grans (Abs): 0 10*3/uL (ref 0.0–0.1)
Immature Granulocytes: 0 %
Lymphocytes Absolute: 1.5 10*3/uL (ref 0.7–3.1)
Lymphs: 20 %
MCH: 29.7 pg (ref 26.6–33.0)
MCHC: 34.1 g/dL (ref 31.5–35.7)
MCV: 87 fL (ref 79–97)
Monocytes Absolute: 0.7 10*3/uL (ref 0.1–0.9)
Monocytes: 9 %
Neutrophils Absolute: 5.2 10*3/uL (ref 1.4–7.0)
Neutrophils: 70 %
Platelets: 177 10*3/uL (ref 150–450)
RBC: 3.84 x10E6/uL (ref 3.77–5.28)
RDW: 12.2 % (ref 11.7–15.4)
RPR Ser Ql: NONREACTIVE
Rh Factor: POSITIVE
Rubella Antibodies, IGG: 2.47 index (ref >0.99)
WBC: 7.5 10*3/uL (ref 3.4–10.8)

## 2019-10-29 LAB — HCV INTERPRETATION

## 2019-10-29 LAB — CERVICOVAGINAL ANCILLARY ONLY
Bacterial Vaginitis (gardnerella): POSITIVE — AB
Candida Glabrata: NEGATIVE
Candida Vaginitis: NEGATIVE
Chlamydia: NEGATIVE
Comment: NEGATIVE
Comment: NEGATIVE
Comment: NEGATIVE
Comment: NEGATIVE
Comment: NEGATIVE
Comment: NORMAL
Neisseria Gonorrhea: NEGATIVE
Trichomonas: NEGATIVE

## 2019-10-29 MED ORDER — METRONIDAZOLE 500 MG PO TABS: 500.0000 mg | ORAL_TABLET | Freq: Two times a day (BID) | ORAL | 0 refills | Status: AC

## 2019-10-30 LAB — URINE CULTURE, OB REFLEX

## 2019-10-30 LAB — CULTURE, OB URINE

## 2019-11-06 ENCOUNTER — Encounter: Payer: Self-pay | Admitting: Obstetrics and Gynecology

## 2019-11-07 ENCOUNTER — Encounter: Payer: Self-pay | Admitting: Obstetrics and Gynecology

## 2019-11-25 ENCOUNTER — Telehealth (INDEPENDENT_AMBULATORY_CARE_PROVIDER_SITE_OTHER): Payer: Self-pay | Admitting: Women's Health

## 2019-11-25 DIAGNOSIS — Z3482 Encounter for supervision of other normal pregnancy, second trimester: Secondary | ICD-10-CM

## 2019-11-25 DIAGNOSIS — Z3480 Encounter for supervision of other normal pregnancy, unspecified trimester: Secondary | ICD-10-CM

## 2019-11-25 DIAGNOSIS — Z3A16 16 weeks gestation of pregnancy: Secondary | ICD-10-CM

## 2019-11-25 NOTE — Patient Instructions (Addendum)
Maternity Assessment Unit (MAU)  The Maternity Assessment Unit (MAU) is located at the [facility] at [facility]. The address is: [address] [place], [place], [place] [address]. Please see map below for additional directions.    The Maternity Assessment Unit is designed to help you during your pregnancy, and for up to 6 weeks after delivery, with any pregnancy- or postpartum-related emergencies, if you think you are in labor, or if your water has broken. For example, if you experience nausea and vomiting, vaginal bleeding, severe abdominal or pelvic pain, elevated blood pressure or other problems related to your pregnancy or postpartum time, please come to the Maternity Assessment Unit for assistance.        Second Trimester of Pregnancy The second trimester is from week 14 through week 27 (months 4 through 6). The second trimester is often a time when you feel your best. Your body has adjusted to being pregnant, and you begin to feel better physically. Usually, morning sickness has lessened or quit completely, you may have more energy, and you may have an increase in appetite. The second trimester is also a time when the fetus is growing rapidly. At the end of the sixth month, the fetus is about 9 inches Borski and weighs about 1 pounds. You will likely begin to feel the baby move (quickening) between 16 and 20 weeks of pregnancy. Body changes during your second trimester Your body continues to go through many changes during your second trimester. The changes vary from woman to woman.  Your weight will continue to increase. You will notice your lower abdomen bulging out.  You may begin to get stretch marks on your hips, abdomen, and breasts.  You may develop headaches that can be relieved by medicines. The medicines should be approved by your health care provider.  You may urinate more often because the fetus is pressing on your bladder.  You may  develop or continue to have heartburn as a result of your pregnancy.  You may develop constipation because certain hormones are causing the muscles that push waste through your intestines to slow down.  You may develop hemorrhoids or swollen, bulging veins (varicose veins).  You may have back pain. This is caused by: ? Weight gain. ? Pregnancy hormones that are relaxing the joints in your pelvis. ? A shift in weight and the muscles that support your balance.  Your breasts will continue to grow and they will continue to become tender.  Your gums may bleed and may be sensitive to brushing and flossing.  Dark spots or blotches (chloasma, mask of pregnancy) may develop on your face. This will likely fade after the baby is born.  A dark line from your belly button to the pubic area (linea nigra) may appear. This will likely fade after the baby is born.  You may have changes in your hair. These can include thickening of your hair, rapid growth, and changes in texture. Some women also have hair loss during or after pregnancy, or hair that feels dry or thin. Your hair will most likely return to normal after your baby is born. What to expect at prenatal visits During a routine prenatal visit:  You will be weighed to make sure you and the fetus are growing normally.  Your blood pressure will be taken.  Your abdomen will be measured to track your baby's growth.  The fetal heartbeat will be listened to.  Any test results from the previous visit  will be discussed. Your health care provider may ask you:  How you are feeling.  If you are feeling the baby move.  If you have had any abnormal symptoms, such as leaking fluid, bleeding, severe headaches, or abdominal cramping.  If you are using any tobacco products, including cigarettes, chewing tobacco, and electronic cigarettes.  If you have any questions. Other tests that may be performed during your second trimester include:  Blood tests  that check for: ? Low iron levels (anemia). ? High blood sugar that affects pregnant women (gestational diabetes) between 67 and 28 weeks. ? Rh antibodies. This is to check for a protein on red blood cells (Rh factor).  Urine tests to check for infections, diabetes, or protein in the urine.  An ultrasound to confirm the proper growth and development of the baby.  An amniocentesis to check for possible genetic problems.  Fetal screens for spina bifida and Down syndrome.  HIV (human immunodeficiency virus) testing. Routine prenatal testing includes screening for HIV, unless you choose not to have this test. Follow these instructions at home: Medicines  Follow your health care provider's instructions regarding medicine use. Specific medicines may be either safe or unsafe to take during pregnancy.  Take a prenatal vitamin that contains at least 600 micrograms (mcg) of folic acid.  If you develop constipation, try taking a stool softener if your health care provider approves. Eating and drinking   Eat a balanced diet that includes fresh fruits and vegetables, whole grains, good sources of protein such as meat, eggs, or tofu, and low-fat dairy. Your health care provider will help you determine the amount of weight gain that is right for you.  Avoid raw meat and uncooked cheese. These carry germs that can cause birth defects in the baby.  If you have low calcium intake from food, talk to your health care provider about whether you should take a daily calcium supplement.  Limit foods that are high in fat and processed sugars, such as fried and sweet foods.  To prevent constipation: ? Drink enough fluid to keep your urine clear or pale yellow. ? Eat foods that are high in fiber, such as fresh fruits and vegetables, whole grains, and beans. Activity  Exercise only as directed by your health care provider. Most women can continue their usual exercise routine during pregnancy. Try to  exercise for 30 minutes at least 5 days a week. Stop exercising if you experience uterine contractions.  Avoid heavy lifting, wear low heel shoes, and practice good posture.  A sexual relationship may be continued unless your health care provider directs you otherwise. Relieving pain and discomfort  Wear a good support bra to prevent discomfort from breast tenderness.  Take warm sitz baths to soothe any pain or discomfort caused by hemorrhoids. Use hemorrhoid cream if your health care provider approves.  Rest with your legs elevated if you have leg cramps or low back pain.  If you develop varicose veins, wear support hose. Elevate your feet for 15 minutes, 3-4 times a day. Limit salt in your diet. Prenatal Care  Write down your questions. Take them to your prenatal visits.  Keep all your prenatal visits as told by your health care provider. This is important. Safety  Wear your seat belt at all times when driving.  Make a list of emergency phone numbers, including numbers for family, friends, the hospital, and police and fire departments. General instructions  Ask your health care provider for a referral to a  local prenatal education class. Begin classes no later than the beginning of month 6 of your pregnancy.  Ask for help if you have counseling or nutritional needs during pregnancy. Your health care provider can offer advice or refer you to specialists for help with various needs.  Do not use hot tubs, steam rooms, or saunas.  Do not douche or use tampons or scented sanitary pads.  Do not cross your legs for [place]udock periods of time.  Avoid cat litter boxes and soil used by cats. These carry germs that can cause birth defects in the baby and possibly loss of the fetus by miscarriage or stillbirth.  Avoid all smoking, herbs, alcohol, and unprescribed drugs. Chemicals in these products can affect the formation and growth of the baby.  Do not use any products that contain nicotine  or tobacco, such as cigarettes and e-cigarettes. If you need help quitting, ask your health care provider.  Visit your dentist if you have not gone yet during your pregnancy. Use a soft toothbrush to brush your teeth and be gentle when you floss. Contact a health care provider if:  You have dizziness.  You have mild pelvic cramps, pelvic pressure, or nagging pain in the abdominal area.  You have persistent nausea, vomiting, or diarrhea.  You have a bad smelling vaginal discharge.  You have pain when you urinate. Get help right away if:  You have a fever.  You are leaking fluid from your vagina.  You have spotting or bleeding from your vagina.  You have severe abdominal cramping or pain.  You have rapid weight gain or weight loss.  You have shortness of breath with chest pain.  You notice sudden or extreme swelling of your face, hands, ankles, feet, or legs.  You have not felt your baby move in over an hour.  You have severe headaches that do not go away when you take medicine.  You have vision changes. Summary  The second trimester is from week 14 through week 27 (months 4 through 6). It is also a time when the fetus is growing rapidly.  Your body goes through many changes during pregnancy. The changes vary from woman to woman.  Avoid all smoking, herbs, alcohol, and unprescribed drugs. These chemicals affect the formation and growth your baby.  Do not use any tobacco products, such as cigarettes, chewing tobacco, and e-cigarettes. If you need help quitting, ask your health care provider.  Contact your health care provider if you have any questions. Keep all prenatal visits as told by your health care provider. This is important. This information is not intended to replace advice given to you by your health care provider. Make sure you discuss any questions you have with your health care provider. Document Revised: 05/10/2018 Document Reviewed: 02/22/2016 Elsevier  Patient Education  2020 Elsevier Inc.        Round Ligament Pain  The round ligament is a cord of muscle and tissue that helps support the uterus. It can become a source of pain during pregnancy if it becomes stretched or twisted as the baby grows. The pain usually begins in the second trimester (13-28 weeks) of pregnancy, and it can come and go until the baby is delivered. It is not a serious problem, and it does not cause harm to the baby. Round ligament pain is usually a short, sharp, and pinching pain, but it can also be a dull, lingering, and aching pain. The pain is felt in the lower side of  the abdomen or in the groin. It usually starts deep in the groin and moves up to the outside of the hip area. The pain may occur when you:  Suddenly change position, such as quickly going from a sitting to standing position.  Roll over in bed.  Cough or sneeze.  Do physical activity. Follow these instructions at home:   Watch your condition for any changes.  When the pain starts, relax. Then try any of these methods to help with the pain: ? Sitting down. ? Flexing your knees up to your abdomen. ? Lying on your side with one pillow under your abdomen and another pillow between your legs. ? Sitting in a warm bath for 15-20 minutes or until the pain goes away.  Take over-the-counter and prescription medicines only as told by your health care provider.  Move slowly when you sit down or stand up.  Avoid Schamberger walks if they cause pain.  Stop or reduce your physical activities if they cause pain.  Keep all follow-up visits as told by your health care provider. This is important. Contact a health care provider if:  Your pain does not go away with treatment.  You feel pain in your back that you did not have before.  Your medicine is not helping. Get help right away if:  You have a fever or chills.  You develop uterine contractions.  You have vaginal bleeding.  You have nausea  or vomiting.  You have diarrhea.  You have pain when you urinate. Summary  Round ligament pain is felt in the lower abdomen or groin. It is usually a short, sharp, and pinching pain. It can also be a dull, lingering, and aching pain.  This pain usually begins in the second trimester (13-28 weeks). It occurs because the uterus is stretching with the growing baby, and it is not harmful to the baby.  You may notice the pain when you suddenly change position, when you cough or sneeze, or during physical activity.  Relaxing, flexing your knees to your abdomen, lying on one side, or taking a warm bath may help to get rid of the pain.  Get help from your health care provider if the pain does not go away or if you have vaginal bleeding, nausea, vomiting, diarrhea, or painful urination. This information is not intended to replace advice given to you by your health care provider. Make sure you discuss any questions you have with your health care provider. Document Revised: 07/04/2017 Document Reviewed: 07/04/2017 Elsevier Patient Education  2020 [employer].        Preterm Labor and Birth Information  The normal length of a pregnancy is 39-41 weeks. Preterm labor is when labor starts before 37 completed weeks of pregnancy. What are the risk factors for preterm labor? Preterm labor is more likely to occur in women who:  Have certain infections during pregnancy such as a bladder infection, sexually transmitted infection, or infection inside the uterus (chorioamnionitis).  Have a shorter-than-normal cervix.  Have gone into preterm labor before.  Have had surgery on their cervix.  Are younger than age 34 or older than age 35.  Are African American.  Are pregnant with twins or multiple babies (multiple gestation).  Take street drugs or smoke while pregnant.  Do not gain enough weight while pregnant.  Became pregnant shortly after having been pregnant. What are the symptoms of  preterm labor? Symptoms of preterm labor include:  Cramps similar to those that can happen during a  menstrual period. The cramps may happen with diarrhea.  Pain in the abdomen or lower back.  Regular uterine contractions that may feel like tightening of the abdomen.  A feeling of increased pressure in the pelvis.  Increased watery or bloody mucus discharge from the vagina.  Water breaking (ruptured amniotic sac). Why is it important to recognize signs of preterm labor? It is important to recognize signs of preterm labor because babies who are born prematurely may not be fully developed. This can put them at an increased risk for:  Castello-term (chronic) heart and lung problems.  Difficulty immediately after birth with regulating body systems, including blood sugar, body temperature, heart rate, and breathing rate.  Bleeding in the brain.  Cerebral palsy.  Learning difficulties.  Death. These risks are highest for babies who are born before 34 weeks of pregnancy. How is preterm labor treated? Treatment depends on the length of your pregnancy, your condition, and the health of your baby. It may involve:  Having a stitch (suture) placed in your cervix to prevent your cervix from opening too early (cerclage).  Taking or being given medicines, such as: ? Hormone medicines. These may be given early in pregnancy to help support the pregnancy. ? Medicine to stop contractions. ? Medicines to help mature the baby's lungs. These may be prescribed if the risk of delivery is high. ? Medicines to prevent your baby from developing cerebral palsy. If the labor happens before 34 weeks of pregnancy, you may need to stay in the hospital. What should I do if I think I am in preterm labor? If you think that you are going into preterm labor, call your health care provider right away. How can I prevent preterm labor in future pregnancies? To increase your chance of having a full-term  pregnancy:  Do not use any tobacco products, such as cigarettes, chewing tobacco, and e-cigarettes. If you need help quitting, ask your health care provider.  Do not use street drugs or medicines that have not been prescribed to you during your pregnancy.  Talk with your health care provider before taking any herbal supplements, even if you have been taking them regularly.  Make sure you gain a healthy amount of weight during your pregnancy.  Watch for infection. If you think that you might have an infection, get it checked right away.  Make sure to tell your health care provider if you have gone into preterm labor before. This information is not intended to replace advice given to you by your health care provider. Make sure you discuss any questions you have with your health care provider. Document Revised: 05/10/2018 Document Reviewed: 06/09/2015 Elsevier Patient Education  2020 [employer].

## 2019-11-25 NOTE — Progress Notes (Signed)
I connected with Brenda Mcneil 11/25/19 at  8:15 AM EDT by: MyChart video and verified that I am speaking with the correct person using two identifiers.  Patient is located at home and provider is located at [facility].     The purpose of this virtual visit is to provide medical care while limiting exposure to the novel coronavirus. I discussed the limitations, risks, security and privacy concerns of performing an evaluation and management service by MyChart video and the availability of in person appointments. I also discussed with the patient that there may be a patient responsible charge related to this service. By engaging in this virtual visit, you consent to the provision of healthcare.  Additionally, you authorize for your insurance to be billed for the services provided during this visit.  The patient expressed understanding and agreed to proceed.  The following staff members participated in the virtual visit:  Donia Ast    PRENATAL VISIT NOTE  Subjective:  Brenda Mcneil is a 29 y.o. G2P1001 at [redacted]w[redacted]d  for phone visit for ongoing prenatal care.  She is currently monitored for the following issues for this low-risk pregnancy and has Supervision of other normal pregnancy, antepartum on their problem list.  Patient reports no complaints.  Contractions: Not present. Vag. Bleeding: None.  Movement: Absent. Denies leaking of fluid.   The following portions of the patient's history were reviewed and updated as appropriate: allergies, current medications, past family history, past medical history, past social history, past surgical history and problem list.   Objective:  There were no vitals filed for this visit. Patient left her BP cuff at her Grandmother's house. Will get BP cuff today and send MyChart message with results. Patient advised must have cuff available for next visit  Fetal Status:     Movement: Absent     Assessment and Plan:  Pregnancy: G2P1001 at [redacted]w[redacted]d 1. Supervision of  other normal pregnancy, antepartum -dating [place] reviewed, EDD 05/06/2019 by LMP, 04/29/2019 by 14w [place]  -no concerns  Preterm labor symptoms and general obstetric precautions including but not limited to vaginal bleeding, contractions, leaking of fluid and fetal movement were reviewed in detail with the patient. I discussed the assessment and treatment plan with the patient. The patient was provided an opportunity to ask questions and all were answered. The patient agreed with the plan and demonstrated an understanding of the instructions. The patient was advised to call back or seek an in-person office evaluation/go to MAU at [facility] for any urgent or concerning symptoms.  Return in about 4 weeks (around 12/23/2019) for virtual LOB/APP OK, needs anatomy scan scheduled at [place] 18-20 weeks.  No future appointments.   Time spent on virtual visit: 10 minutes  Marylen Ponto, NP

## 2019-11-25 NOTE — Progress Notes (Signed)
I connected with  Brenda Mcneil on 11/25/19 by a video enabled telemedicine application and verified that I am speaking with the correct person using two identifiers.   I discussed the limitations of evaluation and management by telemedicine. The patient expressed understanding and agreed to proceed.   MyChart, reports no problems today.  She left her BP cuff at her Grandma's so she is unable to check her BP this AM.

## 2019-12-23 ENCOUNTER — Encounter: Payer: Self-pay | Admitting: Obstetrics

## 2019-12-23 ENCOUNTER — Telehealth (INDEPENDENT_AMBULATORY_CARE_PROVIDER_SITE_OTHER): Payer: Self-pay | Admitting: Obstetrics

## 2019-12-23 DIAGNOSIS — Z3A20 20 weeks gestation of pregnancy: Secondary | ICD-10-CM

## 2019-12-23 DIAGNOSIS — Z3480 Encounter for supervision of other normal pregnancy, unspecified trimester: Secondary | ICD-10-CM

## 2019-12-23 DIAGNOSIS — Z3482 Encounter for supervision of other normal pregnancy, second trimester: Secondary | ICD-10-CM

## 2019-12-23 NOTE — Progress Notes (Signed)
   OBSTETRICS PRENATAL VIRTUAL VISIT ENCOUNTER NOTE  Provider location: [facility] [place]   I connected with Verta Ellen on 12/23/19 at  9:00 AM EST by MyChart Video Encounter at home and verified that I am speaking with the correct person using two identifiers.   I discussed the limitations, risks, security and privacy concerns of performing an evaluation and management service virtually and the availability of in person appointments. I also discussed with the patient that there may be a patient responsible charge related to this service. The patient expressed understanding and agreed to proceed. Subjective:  Brenda Mcneil is a 29 y.o. G2P1001 at [redacted]w[redacted]d being seen today for ongoing prenatal care.  She is currently monitored for the following issues for this low-risk pregnancy and has Supervision of other normal pregnancy, antepartum on their problem list.  Patient reports Mcneil complaints.  Contractions: Not present. Vag. Bleeding: None.  Movement: Present. Denies any leaking of fluid.   The following portions of the patient's history were reviewed and updated as appropriate: allergies, current medications, past family history, past medical history, past social history, past surgical history and problem list.   Objective:   Vitals:   12/23/19 0856  BP: 124/73  Weight: 170 lb (77.1 kg)    Fetal Status:     Movement: Present     General:  Alert, oriented and cooperative. Patient is in Mcneil acute distress.  Respiratory: Normal respiratory effort, Mcneil problems with respiration noted  Mental Status: Normal mood and affect. Normal behavior. Normal judgment and thought content.  Rest of physical exam deferred due to type of encounter  Imaging: Mcneil results found.  Assessment and Plan:  Pregnancy: G2P1001 at [redacted]w[redacted]d 1. Supervision of other normal pregnancy, antepartum   Preterm labor symptoms and general obstetric precautions including but not limited to vaginal bleeding,  contractions, leaking of fluid and fetal movement were reviewed in detail with the patient. I discussed the assessment and treatment plan with the patient. The patient was provided an opportunity to ask questions and all were answered. The patient agreed with the plan and demonstrated an understanding of the instructions. The patient was advised to call back or seek an in-person office evaluation/go to MAU at [facility] for any urgent or concerning symptoms. Please refer to After Visit Summary for other counseling recommendations.   I provided 15 minutes of face-to-face time during this encounter.    Coral Ceo, MD [facility] for [facility], [facility] 12/23/19

## 2020-01-20 ENCOUNTER — Telehealth (INDEPENDENT_AMBULATORY_CARE_PROVIDER_SITE_OTHER): Payer: Self-pay

## 2020-01-20 VITALS — BP 131/76 | HR 96

## 2020-01-20 DIAGNOSIS — Z3A24 24 weeks gestation of pregnancy: Secondary | ICD-10-CM

## 2020-01-20 DIAGNOSIS — Z3480 Encounter for supervision of other normal pregnancy, unspecified trimester: Secondary | ICD-10-CM

## 2020-01-20 DIAGNOSIS — Z3482 Encounter for supervision of other normal pregnancy, second trimester: Secondary | ICD-10-CM

## 2020-01-20 NOTE — Progress Notes (Signed)
S/w pt for virtual visit, pt reports fetal movement, denies pain. 

## 2020-01-20 NOTE — Progress Notes (Signed)
OBSTETRICS PRENATAL VIRTUAL VISIT ENCOUNTER NOTE  Provider location: [facility] [place]   I connected with Brenda Mcneil on 01/20/20 at  8:55 AM EST by MyChart Video Encounter at home and verified that I am speaking with the correct person using two identifiers.   I discussed the limitations, risks, security and privacy concerns of performing an evaluation and management service virtually and the availability of in person appointments. I also discussed with the patient that there may be a patient responsible charge related to this service. The patient expressed understanding and agreed to proceed. Subjective:  Brenda Mcneil is a 29 y.o. G2P1001 at [redacted]w[redacted]d being seen today for ongoing prenatal care.  She is currently monitored for the following issues for this low-risk pregnancy and has Supervision of other normal pregnancy, antepartum on their problem list.  Patient reports no pregnancy related complaints, but states "I don't feel good."  Patient goes on to report that she feels like she has the flu.  She reports chills, body aches, sore throat, nasal congestion, and cough. She has not received a flu or covid vaccine.  She reports her symptoms started last week and she has not taken any medications.   Patient endorses fetal movement and denies vaginal concerns or contractions.  Contractions: Not present. Vag. Bleeding: None.  Movement: Present. Denies any leaking of fluid.   The following portions of the patient's history were reviewed and updated as appropriate: allergies, current medications, past family history, past medical history, past social history, past surgical history and problem list.   Objective:   Vitals:   01/20/20 0848  BP: 131/76  Pulse: 96    Fetal Status:     Movement: Present     General:  Alert, oriented and cooperative. Patient is in no acute distress.  Respiratory: Normal respiratory effort, no problems with respiration noted  Mental Status:  Normal mood and affect. Normal behavior. Normal judgment and thought content.  Rest of physical exam deferred due to type of encounter  Imaging: No results found.  Assessment and Plan:  Pregnancy: G2P1001 at [redacted]w[redacted]d 1. Supervision of other normal pregnancy, antepartum -Anticipatory guidance for upcoming appts.  -Reviewed Glucola appt preparation including fasting the night before and morning of.   -Discussed anticipated office time of 2.5-3 hours.  -Reviewed blood draw procedures and labs which also include check of iron level.  -Discussed how results of GTT are handled including diabetic education and BS testing for abnormal results and routine care for normal results.   2. [redacted] weeks gestation of pregnancy -Doing well regarding pregnancy, but has flu-like symptoms. -Discussed treatment of symptoms with medications.  Pregnancy list uploaded to AVS. -Instructed to get flu and covid testing. 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  Informed that note would be provided in mychart.   Preterm labor symptoms and general obstetric precautions including but not limited to vaginal bleeding, contractions, leaking of fluid and fetal movement were reviewed in detail with the patient.  discussed the assessment and treatment plan with the patient. The patient was provided an opportunity to ask questions and all were answered. The patient agreed with the plan and demonstrated an understanding of the instructions. The patient was advised to call back or seek an in-person office evaluation/go to [facility] at [facility] for any urgent or concerning symptoms. Please refer to After Visit Summary for other counseling recommendations.   I provided 8 minutes of  face-to-face time during this encounter.  No follow-ups on file.  No future appointments.  Cherre Robins, CNM [facility] [employer], [facility]

## 2020-01-20 NOTE — Patient Instructions (Signed)

## 2020-01-22 ENCOUNTER — Emergency Department (HOSPITAL_COMMUNITY): Payer: Self-pay

## 2020-01-22 ENCOUNTER — Other Ambulatory Visit: Payer: Self-pay

## 2020-01-22 ENCOUNTER — Emergency Department (HOSPITAL_COMMUNITY)
Admission: EM | Admit: 2020-01-22 | Discharge: 2020-01-22 | Disposition: A | Discharge status: Home / Self Care | Payer: Self-pay | Attending: Emergency Medicine | Admitting: Emergency Medicine

## 2020-01-22 ENCOUNTER — Encounter (HOSPITAL_COMMUNITY): Payer: Self-pay

## 2020-01-22 DIAGNOSIS — U071 COVID-19: Secondary | ICD-10-CM | POA: Insufficient documentation

## 2020-01-22 DIAGNOSIS — Z3A26 26 weeks gestation of pregnancy: Secondary | ICD-10-CM | POA: Insufficient documentation

## 2020-01-22 DIAGNOSIS — E876 Hypokalemia: Secondary | ICD-10-CM | POA: Insufficient documentation

## 2020-01-22 DIAGNOSIS — J1282 Pneumonia due to coronavirus disease 2019: Secondary | ICD-10-CM | POA: Insufficient documentation

## 2020-01-22 DIAGNOSIS — O99282 Endocrine, nutritional and metabolic diseases complicating pregnancy, second trimester: Secondary | ICD-10-CM | POA: Insufficient documentation

## 2020-01-22 DIAGNOSIS — O98512 Other viral diseases complicating pregnancy, second trimester: Secondary | ICD-10-CM | POA: Insufficient documentation

## 2020-01-22 LAB — CBC
HCT: 35.6 % — ABNORMAL LOW (ref 36.0–46.0)
Hemoglobin: 11.7 g/dL — ABNORMAL LOW (ref 12.0–15.0)
MCH: 27.9 pg (ref 26.0–34.0)
MCHC: 32.9 g/dL (ref 30.0–36.0)
MCV: 85 fL (ref 80.0–100.0)
Platelets: 168 10*3/uL (ref 150–400)
RBC: 4.19 MIL/uL (ref 3.87–5.11)
RDW: 12.6 % (ref 11.5–15.5)
WBC: 5.8 10*3/uL (ref 4.0–10.5)
nRBC: 0 % (ref 0.0–0.2)

## 2020-01-22 LAB — BASIC METABOLIC PANEL
Anion gap: 12 (ref 5–15)
BUN: <5 mg/dL — ABNORMAL LOW (ref 6–20)
CO2: 23 mmol/L (ref 22–32)
Calcium: 8.1 mg/dL — ABNORMAL LOW (ref 8.9–10.3)
Chloride: 101 mmol/L (ref 98–111)
Creatinine, Ser: 0.6 mg/dL (ref 0.44–1.00)
GFR, Estimated: >60 mL/min (ref >60)
Glucose, Bld: 140 mg/dL — ABNORMAL HIGH (ref 70–99)
Potassium: 2.7 mmol/L — CL (ref 3.5–5.1)
Sodium: 136 mmol/L (ref 135–145)

## 2020-01-22 LAB — MAGNESIUM: Magnesium: 2.1 mg/dL (ref 1.7–2.4)

## 2020-01-22 MED ORDER — METHYLPREDNISOLONE SODIUM SUCC 125 MG IJ SOLR: 125.0000 mg | Freq: Once | INTRAMUSCULAR | Status: DC | PRN

## 2020-01-22 MED ORDER — ALBUTEROL SULFATE HFA 108 (90 BASE) MCG/ACT IN AERS: 2.0000 | INHALATION_SPRAY | Freq: Once | RESPIRATORY_TRACT | Status: DC | PRN

## 2020-01-22 MED ORDER — FAMOTIDINE IN NACL 20-0.9 MG/50ML-% IV SOLN
20.0000 mg | Freq: Once | INTRAVENOUS | Status: DC | PRN
  Filled 2020-01-22: qty 50

## 2020-01-22 MED ORDER — SODIUM CHLORIDE 0.9 % IV SOLN
Freq: Once | INTRAVENOUS | Status: AC
  Filled 2020-01-22: qty 20

## 2020-01-22 MED ORDER — POTASSIUM CHLORIDE CRYS ER 20 MEQ PO TBCR
40.0000 meq | EXTENDED_RELEASE_TABLET | Freq: Once | ORAL | Status: AC
  Administered 2020-01-22: 15:00:00 40 meq via ORAL
  Filled 2020-01-22: qty 2

## 2020-01-22 MED ORDER — DIPHENHYDRAMINE HCL 50 MG/ML IJ SOLN: 50.0000 mg | Freq: Once | INTRAMUSCULAR | Status: DC | PRN

## 2020-01-22 MED ORDER — POTASSIUM CHLORIDE ER 10 MEQ PO TBCR: 10.0000 meq | EXTENDED_RELEASE_TABLET | Freq: Two times a day (BID) | ORAL | 0 refills | Status: DC

## 2020-01-22 MED ORDER — SODIUM CHLORIDE 0.9 % IV SOLN: 1200.0000 mg | Freq: Once | INTRAVENOUS | Status: DC

## 2020-01-22 MED ORDER — SODIUM CHLORIDE 0.9 % IV BOLUS
1000.0000 mL | Freq: Once | INTRAVENOUS | Status: AC
  Administered 2020-01-22: 15:00:00 1000 mL via INTRAVENOUS

## 2020-01-22 MED ORDER — SODIUM CHLORIDE 0.9 % IV SOLN: INTRAVENOUS | Status: DC | PRN

## 2020-01-22 MED ORDER — POTASSIUM CHLORIDE 10 MEQ/100ML IV SOLN
10.0000 meq | Freq: Once | INTRAVENOUS | Status: AC
  Administered 2020-01-22: 18:00:00 10 meq via INTRAVENOUS
  Filled 2020-01-22: qty 100

## 2020-01-22 MED ORDER — EPINEPHRINE 0.3 MG/0.3ML IJ SOAJ: 0.3000 mg | Freq: Once | INTRAMUSCULAR | Status: DC | PRN

## 2020-01-22 NOTE — ED Triage Notes (Signed)
Pt tested positive for COVID on 12/21, reports worsening SOB. Pt is [redacted] weeks pregnant, resp e.u

## 2020-01-22 NOTE — Discharge Instructions (Addendum)
COVID-19 Home Management:  You have had a positive test for COVID-19. COVID-19 is caused by a virus. Viruses do not require or respond to antibiotics. Treatment is symptomatic care and it is important to note that these symptoms may last for 7-14 days.   Hand washing: Wash your hands throughout the day, but especially before and after touching the face, using the restroom, sneezing, coughing, or touching surfaces that have been coughed or sneezed upon. Hydration: Symptoms of most illnesses will be intensified and complicated by dehydration. Dehydration can also extend the duration of symptoms. Drink plenty of fluids and get plenty of rest. You should be drinking at least half a liter of water an hour to stay hydrated. Electrolyte drinks (ex. Gatorade, Powerade, Pedialyte) are also encouraged. You should be drinking enough fluids to make your urine light yellow, almost clear. If this is not the case, you are not drinking enough water. Please note that some of the treatments indicated below will not be effective if you are not adequately hydrated. Diet: Please concentrate on hydration, however, you may introduce food slowly.  Start with a clear liquid diet, progressed to a full liquid diet, and then bland solids as you are able. Pain or fever: Acetaminophen (generic for Tylenol) for pain or fever.  Acetaminophen: May take acetaminophen (generic for Tylenol), as needed, for pain. Your daily total maximum amount of acetaminophen from all sources should be limited to 4000mg /day for persons without liver problems, or 2000mg /day for those with liver problems. Nausea: Things that can help with nausea/vomiting also include peppermint/menthol candies, vitamin B12, and ginger. Cough: Teas, warm liquids, broths, and honey can help with cough. Fluticasone: Use fluticasone (generic for Flonase), as directed, for nasal and sinus congestion.  This medication is available over-the-counter. Sore throat: Warm liquids or  Chloraseptic spray may help soothe a sore throat. Gargle twice a day with a salt water solution made from a half teaspoon of salt in a cup of warm water.  Follow up: Follow up with a primary care provider within the next two weeks should symptoms fail to resolve. Return: Return to the ED for significantly worsening symptoms, shortness of breath, persistent/worsening chest pain, persistent vomiting, large amounts of blood in stool, worsening/localized abdominal pain, or any other major concerns.  For prescription assistance, may try using prescription discount sites or apps, such as goodrx.com  Low potassium (hypokalemia): Your potassium was lower than normal. Please refer to the attached pages regarding changes in diet to increase your potassium. You have also been prescribed a course of potassium supplementation. Finish this course of medication and recommend having your potassium rechecked through a primary care provider.

## 2020-01-22 NOTE — Progress Notes (Addendum)
OB Rapid Response called to assess pt who is G2 P1 at [redacted]w[redacted]d.  Pt was diagnosed with covid two days ago and came into ER today complaining of SOB. Her O2 sat is 100% on room air. Pt reports previous vaginal delivery with no complications.  She also reports positive fetal movement, no vag bleeding and no LOF.  Pt's potassium is 2.7 and she has been given oral medication for that.  RROB will monitor fhr and call Attending for orders.

## 2020-01-22 NOTE — ED Notes (Addendum)
Erin,Rapid Response OB nurse, called for assessment.

## 2020-01-22 NOTE — ED Provider Notes (Signed)
Brenda Mcneil is a 29 y.o. female, presenting to the ED with shortness of breath, poor appetite, cough, and overall feeling poorly in the setting of diagnosed Covid infection.    HPI from Terance Hart, PA-C: "Brenda Mcneil is a 29 y.o. female who is [redacted] weeks pregnant who presents with SOB. She states she started to feel sick 9 days ago. She has chills, body aches, a poor appetite, and a cough. She was diagnosed with COVID at UC a couple days ago. She started to have SOB this morning so came to the ED. She sees [facility] for prenatal care. Her pregnancy has been uncomplicated. She denies abdominal pain, N/V, leakage of fluid, vaginal bleeding. She feels baby moving normally."  Past Medical History:  Diagnosis Date  . Medical history non-contributory      Physical Exam  BP 120/78   Pulse (!) 102   Temp 98.5 F (36.9 C) (Oral)   Resp (!) 23   Ht 5\' 8"  (1.727 m)   Wt 78.5 kg   LMP 07/30/2019   SpO2 100%   BMI 26.30 kg/m   Physical Exam Vitals and nursing note reviewed.  Constitutional:      General: She is not in acute distress.    Appearance: She is well-developed. She is not diaphoretic.  HENT:     Head: Normocephalic and atraumatic.     Mouth/Throat:     Mouth: Mucous membranes are moist.     Pharynx: Oropharynx is clear.  Eyes:     Conjunctiva/sclera: Conjunctivae normal.  Cardiovascular:     Rate and Rhythm: Normal rate and regular rhythm.     Pulses: Normal pulses.          Radial pulses are 2+ on the right side and 2+ on the left side.       Posterior tibial pulses are 2+ on the right side and 2+ on the left side.     Heart sounds: Normal heart sounds.     Comments: Tactile temperature in the extremities appropriate and equal bilaterally. Pulmonary:     Effort: Pulmonary effort is normal. No respiratory distress.     Breath sounds: Normal breath sounds.     Comments: No increased work of breathing.  Speaks in full sentences without difficulty. Abdominal:     Palpations:  Abdomen is soft.     Tenderness: There is no abdominal tenderness. There is no guarding.  Musculoskeletal:     Cervical back: Neck supple.     Right lower leg: No edema.     Left lower leg: No edema.  Lymphadenopathy:     Cervical: No cervical adenopathy.  Skin:    General: Skin is warm and dry.  Neurological:     Mental Status: She is alert.  Psychiatric:        Mood and Affect: Mood and affect normal.        Speech: Speech normal.        Behavior: Behavior normal.     ED Course/Procedures    Procedures   Abnormal Labs Reviewed  BASIC METABOLIC PANEL - Abnormal; Notable for the following components:      Result Value   Potassium 2.7 (*)    Glucose, Bld 140 (*)    BUN <5 (*)    Calcium 8.1 (*)    All other components within normal limits  CBC - Abnormal; Notable for the following components:   Hemoglobin 11.7 (*)    HCT 35.6 (*)    All  other components within normal limits   Hemoglobin  Date Value Ref Range Status  01/22/2020 11.7 (L) 12.0 - 15.0 g/dL Final  16/60/6301 60.1 11.1 - 15.9 g/dL Final  09/32/3557 32.2 (L) 12.0 - 16.0 g/dL Final  02/54/2706 23.7 12.0 - 16.0 g/dL Final    DG Chest Portable 1 View  Result Date: 01/22/2020 CLINICAL DATA:  Shortness of breath, COVID positive EXAM: PORTABLE CHEST 1 VIEW COMPARISON:  None. FINDINGS: Patchy bilateral opacities in the lower lungs. No significant pleural effusion. No pneumothorax. Cardiomediastinal contours are within normal limits. IMPRESSION: Patchy bilateral pulmonary opacities may reflect COVID-19 pneumonia. Electronically Signed   By: Guadlupe Spanish M.D.   On: 01/22/2020 13:11    MDM   Clinical Course as of 01/22/20 2057  Thu Jan 22, 2020  1635 Potassium(!!): 2.7 Last value available in the system was 3.8 noted in care everywhere July 2021. [SJ]  1640 Hemoglobin(!): 11.7 Consistent with previous values. [SJ]    Clinical Course User Index [SJ] Anselm Pancoast, PA-C    Patient care handoff report  received from Terance Hart, PA-C. Plan: Patient to receive MAB infusion here in the ED.  Patient presents with cough, shortness of breath, poor appetite in the setting of Covid. Patient is nontoxic appearing, afebrile, not tachycardic upon my evaluation, not tachypneic, not hypotensive, maintains excellent SPO2 on room air, and is in no apparent distress.   I have reviewed the patient's chart to obtain more information.   I reviewed and interpreted the patient's labs and radiological studies. Hypokalemia noted and addressed. OB/GYN consulted by previous provider. Patient was assessed by rapid OB nurse here in the ED.  Fetal heart rate 150.  Patient was reassessed multiple times during her ED course.  She was assessed after receiving MAB infusion and was also observed afterward without noted adverse events.   Patient's lungs are clear.  Additional therapy for her shortness of breath, such as albuterol, was considered, however, we opted against due to her hypokalemia and the concern that the albuterol could further reduce this value.  The patient was given instructions for home care as well as return precautions. Patient voices understanding of these instructions, accepts the plan, and is comfortable with discharge.   Findings and plan of care discussed with attending physician, Marguarite Arbour, MD.   Brenda Mcneil was evaluated in Emergency Department on 01/22/2020 for the symptoms described in the history of present illness. She was evaluated in the context of the global COVID-19 pandemic, which necessitated consideration that the patient might be at risk for infection with the SARS-CoV-2 virus that causes COVID-19. Institutional protocols and algorithms that pertain to the evaluation of patients at risk for COVID-19 are in a state of rapid change based on information released by regulatory bodies including the CDC and federal and state organizations. These policies and algorithms were followed  during the patient's care in the ED.   Vitals:   01/22/20 1209 01/22/20 1404 01/22/20 1430  BP: 124/73 113/69 120/78  Pulse: (!) 107 (!) 101 (!) 102  Resp: 20 (!) 22 (!) 23  Temp: 98.5 F (36.9 C)    TempSrc: Oral    SpO2: 100% 100% 100%  Weight: 78.5 kg    Height: 5\' 8"  (1.727 m)     Vitals:   01/22/20 1930 01/22/20 1945 01/22/20 2000 01/22/20 2009  BP: 118/72 113/75 113/77   Pulse: (!) 104 (!) 102 (!) 104   Resp: (!) 21 (!) 22 (!) 28   Temp:  99 F (37.2 C)  TempSrc:    Oral  SpO2: 100% 100% 100%   Weight:      Height:          Concepcion Living 01/22/20 2057    Terald Sleeper, MD 01/23/20 1004

## 2020-01-22 NOTE — Progress Notes (Signed)
Dr Alysia Penna made aware of pt and POC from ED stand point.  MD says to get of fhr tracing and that pt may be cleared by OB.  Will inform ED to notify OB if patient ends up being admitted.

## 2020-01-22 NOTE — ED Provider Notes (Signed)
[facility] EMERGENCY DEPARTMENT Provider Note   CSN: [id number] Arrival date & time: 01/22/20  1157     History Chief Complaint  Patient presents with  . Covid Positive  . Shortness of Breath    Brenda Mcneil is a 29 y.o. female who is [redacted] weeks pregnant who presents with SOB. She states she started to feel sick 9 days ago. She has chills, body aches, a poor appetite, and a cough. She was diagnosed with COVID at UC a couple days ago. She started to have SOB this morning so came to the ED. She sees [facility] for prenatal care. Her pregnancy has been uncomplicated. She denies abdominal pain, N/V, leakage of fluid, vaginal bleeding. She feels baby moving normally.    HPI     Past Medical History:  Diagnosis Date  . Medical history non-contributory     Patient Active Problem List   Diagnosis Date Noted  . Supervision of other normal pregnancy, antepartum 10/28/2019    Past Surgical History:  Procedure Laterality Date  . ARTHROSCOPIC REPAIR ACL Left   . KNEE ARTHROSCOPY W/ ACL RECONSTRUCTION       OB History    Gravida  2   Para  1   Term  1   Preterm  0   AB  0   Living  1     SAB  0   IAB  0   Ectopic  0   Multiple  0   Live Births  1           No family history on file.  Social History   Tobacco Use  . Smoking status: Never Smoker  . Smokeless tobacco: Never Used  Vaping Use  . Vaping Use: Never used  Substance Use Topics  . Alcohol use: No  . Drug use: No    Home Medications Prior to Admission medications   Medication Sig Start Date End Date Taking? Authorizing Provider  Prenatal Vit-Fe Fumarate-FA (PRENATAL VITAMINS PLUS PO) Take 1 tablet by mouth daily.   Yes [provider]  Blood Pressure Monitoring (BLOOD PRESSURE CUFF) MISC 1 Device by Does not apply route once a week. Patient not taking: No sig reported 10/28/19   Johnny Bridge, MD    Allergies    Hydrocodone-acetaminophen and Vicodin  [hydrocodone-acetaminophen]  Review of Systems   Review of Systems  Constitutional: Positive for activity change, appetite change, chills and fatigue. Negative for fever.  HENT: Positive for congestion.   Respiratory: Positive for cough and shortness of breath.   Cardiovascular: Negative for chest pain.  Gastrointestinal: Negative for abdominal pain, diarrhea, nausea and vomiting.  All other systems reviewed and are negative.   Physical Exam Updated Vital Signs BP 113/69 (BP Location: Right Arm)   Pulse (!) 101   Temp 98.5 F (36.9 C) (Oral)   Resp (!) 22   Ht 5\' 8"  (1.727 m)   Wt 78.5 kg   LMP 07/30/2019   SpO2 100%   BMI 26.30 kg/m   Physical Exam Vitals and nursing note reviewed.  Constitutional:      General: She is not in acute distress.    Appearance: She is well-developed and well-nourished. She is not ill-appearing.  HENT:     Head: Normocephalic and atraumatic.  Eyes:     General: No scleral icterus.       Right eye: No discharge.        Left eye: No discharge.  Conjunctiva/sclera: Conjunctivae normal.     Pupils: Pupils are equal, round, and reactive to light.  Cardiovascular:     Rate and Rhythm: Regular rhythm. Tachycardia present.  Pulmonary:     Effort: Pulmonary effort is normal. No respiratory distress.     Breath sounds: Normal breath sounds.  Abdominal:     General: There is no distension.  Musculoskeletal:     Cervical back: Normal range of motion.  Skin:    General: Skin is warm and dry.  Neurological:     Mental Status: She is alert and oriented to person, place, and time.  Psychiatric:        Mood and Affect: Mood and affect normal.        Behavior: Behavior normal.     ED Results / Procedures / Treatments   Labs (all labs ordered are listed, but only abnormal results are displayed) Labs Reviewed  BASIC METABOLIC PANEL - Abnormal; Notable for the following components:      Result Value   Potassium 2.7 (*)    Glucose, Bld 140  (*)    BUN <5 (*)    Calcium 8.1 (*)    All other components within normal limits  CBC - Abnormal; Notable for the following components:   Hemoglobin 11.7 (*)    HCT 35.6 (*)    All other components within normal limits    EKG None  Radiology DG Chest Portable 1 View  Result Date: 01/22/2020 CLINICAL DATA:  Shortness of breath, COVID positive EXAM: PORTABLE CHEST 1 VIEW COMPARISON:  None. FINDINGS: Patchy bilateral opacities in the lower lungs. No significant pleural effusion. No pneumothorax. Cardiomediastinal contours are within normal limits. IMPRESSION: Patchy bilateral pulmonary opacities may reflect COVID-19 pneumonia. Electronically Signed   By: Guadlupe Spanish M.D.   On: 01/22/2020 13:11    Procedures Procedures (including critical care time)  Medications Ordered in ED Medications - No data to display  ED Course  I have reviewed the triage vital signs and the nursing notes.  Pertinent labs & imaging results that were available during my care of the patient were reviewed by me and considered in my medical decision making (see chart for details).  29 year old female who is [redacted] weeks pregnant presents with shortness of breath due to Covid.  She is mildly tachycardic and tachypneic but otherwise vital signs are normal. She is satting at 100% on room air.  Chest x-ray obtained in triage shows patchy bilateral pneumonia.  Blood work shows mild hypokalemia of 2.7.  Discussed with OB/GYN at [facility].  They recommend starting monoclonal antibodies and to treat her hypokalemia.  They recommend close follow-up with her OB/GYN.  At shift change the patient is in the process of receiving monoclonal antibodies.  Rapid OB nurse is coming to evaluate the fetus.  Care was signed out to Harolyn Rutherford, PA-C who will disposition the patient.  MDM Rules/Calculators/A&P                          Final Clinical Impression(s) / ED Diagnoses Final diagnoses:  Pneumonia due to COVID-19 virus  Hypokalemia     Rx / DC Orders ED Discharge Orders    None       Bethel Born, PA-C 01/22/20 1527    Pollyann Savoy, MD 01/27/20 [phone]

## 2020-01-31 NOTE — L&D Delivery Note (Addendum)
OB/GYN Faculty Practice Delivery Note  Brenda Mcneil is a 30 y.o. G2P1001 s/p vaginal delivery at [redacted]w[redacted]d. She was admitted for IOL for gHTN.   ROM: 0h 85m with clear fluid GBS Status:  Negative/-- (03/09 1225) Maximum Maternal Temperature:  Temp (24hrs), Avg:98 F (36.7 C), Min:98 F (36.7 C), Max:98 F (36.7 C)  Labor Progress: Given initial cervical exam, pt was started on pitocin on admission. She progressed to complete cervical dilation. AROM for clear fluid immediately prior to pushing. Pt then had an uncomplicated delivery as noted below.  Delivery Date/Time: 04/28/2020 at 1947 Delivery: Called to room and patient was complete and pushing. Head delivered in ROA position. No nuchal cord present. Shoulder and body delivered in usual fashion. Infant with spontaneous cry, placed on mother's abdomen, dried and stimulated. Cord clamped x 2 after 1-minute delay, and cut by nursing staff. Cord blood drawn. Placenta delivered spontaneously with gentle cord traction. Fundus firm with massage and Pitocin. Labia, perineum, vagina, and cervix inspected with left periurethral laceration repaired with 4-0 vicryl suture.  Placenta: Spontaneous, intact, 3 vessel cord, sent to L&D Complications: None  Lacerations: Left periurethral laceration repaired with 4-0 vicryl  EBL: 200 mL  Analgesia: epidural    Infant: Viable female APGAR (1 MIN): 9   APGAR (5 MINS): 9    Weight: per medical record  Derrel Nip, MD  PGY-2, [facility]  04/28/2020 8:11 PM  I was present and gloved for delivery of infant and placenta. I assisted with vaginal laceration repair as noted above.  Sheila Oats, MD OB Fellow, Faculty Practice 04/28/2020 8:23 PM

## 2020-02-17 ENCOUNTER — Encounter: Payer: Self-pay | Admitting: Nurse Practitioner

## 2020-02-17 ENCOUNTER — Other Ambulatory Visit: Payer: Self-pay

## 2020-02-24 ENCOUNTER — Encounter: Payer: Self-pay | Admitting: Advanced Practice Midwife

## 2020-02-24 ENCOUNTER — Ambulatory Visit (INDEPENDENT_AMBULATORY_CARE_PROVIDER_SITE_OTHER): Payer: Self-pay | Admitting: Advanced Practice Midwife

## 2020-02-24 ENCOUNTER — Other Ambulatory Visit: Payer: Self-pay

## 2020-02-24 VITALS — BP 121/67 | HR 81 | Wt 179.0 lb

## 2020-02-24 DIAGNOSIS — O98512 Other viral diseases complicating pregnancy, second trimester: Secondary | ICD-10-CM

## 2020-02-24 DIAGNOSIS — Z3A29 29 weeks gestation of pregnancy: Secondary | ICD-10-CM

## 2020-02-24 DIAGNOSIS — Z3480 Encounter for supervision of other normal pregnancy, unspecified trimester: Secondary | ICD-10-CM

## 2020-02-24 DIAGNOSIS — U071 COVID-19: Secondary | ICD-10-CM

## 2020-02-24 NOTE — Progress Notes (Signed)
ROB GTT today.  Declines T-Dap at this time.    CC: Done

## 2020-02-24 NOTE — Progress Notes (Addendum)
   PRENATAL VISIT NOTE  Subjective:  Brenda Mcneil is a 30 y.o. G2P1001 at [redacted]w[redacted]d being seen today for ongoing prenatal care.  She is currently monitored for the following issues for this low-risk pregnancy and has Supervision of other normal pregnancy, antepartum on their problem list.  Patient reports no complaints.  Contractions: Not present. Vag. Bleeding: None.  Movement: Present. Denies leaking of fluid.   The following portions of the patient's history were reviewed and updated as appropriate: allergies, current medications, past family history, past medical history, past social history, past surgical history and problem list.   Objective:   Vitals:   02/24/20 0948  BP: 121/67  Pulse: 81  Weight: 179 lb (81.2 kg)    Fetal Status: Fetal Heart Rate (bpm): 152 Fundal Height: 29 cm Movement: Present     General:  Alert, oriented and cooperative. Patient is in no acute distress.  Skin: Skin is warm and dry. No rash noted.   Cardiovascular: Normal heart rate noted  Respiratory: Normal respiratory effort, no problems with respiration noted  Abdomen: Soft, gravid, appropriate for gestational age.  Pain/Pressure: Absent     Pelvic: Cervical exam deferred        Extremities: Normal range of motion.  Edema: None  Mental Status: Normal mood and affect. Normal behavior. Normal judgment and thought content.   Assessment and Plan:  Pregnancy: G2P1001 at [redacted]w[redacted]d 1. Supervision of other normal pregnancy, antepartum --Anticipatory guidance about next visits/weeks of pregnancy given. --Next visit in 2 weeks, virtual  2. [redacted] weeks gestation of pregnancy   3. COVID-19 affecting pregnancy in second trimester --Dx 12/21, symptoms resolved  Preterm labor symptoms and general obstetric precautions including but not limited to vaginal bleeding, contractions, leaking of fluid and fetal movement were reviewed in detail with the patient. Please refer to After Visit Summary for other counseling  recommendations.   Return in about 2 weeks (around 03/09/2020).  Future Appointments  Date Time Provider Department Center  03/09/2020  3:00 PM Brock Bad, MD [facility] None    Sharen Counter, CNM

## 2020-02-24 NOTE — Patient Instructions (Signed)

## 2020-02-26 LAB — CBC
Hematocrit: 30.9 % — ABNORMAL LOW (ref 34.0–46.6)
Hemoglobin: 10.4 g/dL — ABNORMAL LOW (ref 11.1–15.9)
MCH: 28.1 pg (ref 26.6–33.0)
MCHC: 33.7 g/dL (ref 31.5–35.7)
MCV: 84 fL (ref 79–97)
Platelets: 136 10*3/uL — ABNORMAL LOW (ref 150–450)
RBC: 3.7 x10E6/uL — ABNORMAL LOW (ref 3.77–5.28)
RDW: 13.2 % (ref 11.7–15.4)
WBC: 7.5 10*3/uL (ref 3.4–10.8)

## 2020-02-26 LAB — GLUCOSE TOLERANCE, 2 HOURS W/ 1HR
Glucose, 1 hour: 150 mg/dL (ref 65–179)
Glucose, 2 hour: 111 mg/dL (ref 65–152)
Glucose, Fasting: 74 mg/dL (ref 65–91)

## 2020-02-26 LAB — RPR: RPR Ser Ql: REACTIVE — AB

## 2020-02-26 LAB — RPR, QUANT+TP ABS (REFLEX)
Rapid Plasma Reagin, Quant: 1:1 {titer} — ABNORMAL HIGH
T Pallidum Abs: NONREACTIVE

## 2020-02-26 LAB — HIV ANTIBODY (ROUTINE TESTING W REFLEX): HIV Screen 4th Generation wRfx: NONREACTIVE

## 2020-03-02 ENCOUNTER — Encounter: Payer: Self-pay | Admitting: Advanced Practice Midwife

## 2020-03-02 DIAGNOSIS — R768 Other specified abnormal immunological findings in serum: Secondary | ICD-10-CM

## 2020-03-02 HISTORY — DX: Other specified abnormal immunological findings in serum: R76.8

## 2020-03-09 ENCOUNTER — Telehealth (INDEPENDENT_AMBULATORY_CARE_PROVIDER_SITE_OTHER): Payer: Self-pay | Admitting: Obstetrics

## 2020-03-09 ENCOUNTER — Encounter: Payer: Self-pay | Admitting: Obstetrics

## 2020-03-09 DIAGNOSIS — Z3483 Encounter for supervision of other normal pregnancy, third trimester: Secondary | ICD-10-CM

## 2020-03-09 DIAGNOSIS — Z3A31 31 weeks gestation of pregnancy: Secondary | ICD-10-CM

## 2020-03-09 DIAGNOSIS — Z3480 Encounter for supervision of other normal pregnancy, unspecified trimester: Secondary | ICD-10-CM

## 2020-03-09 NOTE — Progress Notes (Signed)
Virtual ROB   CC: None   Pt able to check B/P while on call.  B/P: 111/67  P:84

## 2020-03-09 NOTE — Progress Notes (Signed)
   OBSTETRICS PRENATAL VIRTUAL VISIT ENCOUNTER NOTE  Provider location: Center for [facility] [place]   I connected with Verta Ellen on 03/09/20 at  3:00 PM EST by MyChart Video Encounter at home and verified that I am speaking with the correct person using two identifiers.   I discussed the limitations, risks, security and privacy concerns of performing an evaluation and management service virtually and the availability of in person appointments. I also discussed with the patient that there may be a patient responsible charge related to this service. The patient expressed understanding and agreed to proceed. Subjective:  Brenda Mcneil is a 30 y.o. G2P1001 at [redacted]w[redacted]d being seen today for ongoing prenatal care.  She is currently monitored for the following issues for this low-risk pregnancy and has Supervision of other normal pregnancy, antepartum and Biological false positive RPR test on their problem list.  Patient reports no complaints.  Contractions: Not present. Vag. Bleeding: None.  Movement: Present. Denies any leaking of fluid.   The following portions of the patient's history were reviewed and updated as appropriate: allergies, current medications, past family history, past medical history, past social history, past surgical history and problem list.   Objective:  There were no vitals filed for this visit.  Fetal Status:     Movement: Present     General:  Alert, oriented and cooperative. Patient is in no acute distress.  Respiratory: Normal respiratory effort, no problems with respiration noted  Mental Status: Normal mood and affect. Normal behavior. Normal judgment and thought content.  Rest of physical exam deferred due to type of encounter  Imaging: No results found.  Assessment and Plan:  Pregnancy: G2P1001 at [redacted]w[redacted]d 1. Supervision of other normal pregnancy, antepartum   Preterm labor symptoms and general obstetric precautions including but not limited to vaginal  bleeding, contractions, leaking of fluid and fetal movement were reviewed in detail with the patient. I discussed the assessment and treatment plan with the patient. The patient was provided an opportunity to ask questions and all were answered. The patient agreed with the plan and demonstrated an understanding of the instructions. The patient was advised to call back or seek an in-person office evaluation/go to [facility]U at [facility] for any urgent or concerning symptoms. Please refer to After Visit Summary for other counseling recommendations.   I provided 15 minutes of face-to-face time during this encounter.  Return in about 2 weeks (around 03/23/2020) for MyChart.   Coral Ceo, MD Center for [facility], [facility] 03/09/20

## 2020-03-24 ENCOUNTER — Encounter: Payer: Self-pay | Admitting: Obstetrics

## 2020-03-24 ENCOUNTER — Ambulatory Visit (INDEPENDENT_AMBULATORY_CARE_PROVIDER_SITE_OTHER): Payer: Self-pay | Admitting: Obstetrics

## 2020-03-24 ENCOUNTER — Other Ambulatory Visit: Payer: Self-pay

## 2020-03-24 VITALS — BP 117/67 | HR 81 | Wt 191.0 lb

## 2020-03-24 DIAGNOSIS — Z3480 Encounter for supervision of other normal pregnancy, unspecified trimester: Secondary | ICD-10-CM

## 2020-03-24 NOTE — Progress Notes (Signed)
ROB, c/o swollen feet and right hand x 1 week.

## 2020-03-24 NOTE — Progress Notes (Signed)
Subjective:  Brenda Mcneil is a 30 y.o. G2P1001 at [redacted]w[redacted]d being seen today for ongoing prenatal care.  She is currently monitored for the following issues for this low-risk pregnancy and has Supervision of other normal pregnancy, antepartum and Biological false positive RPR test on their problem list.  Patient reports no complaints.  Contractions: Not present. Vag. Bleeding: None.  Movement: Present. Denies leaking of fluid.   The following portions of the patient's history were reviewed and updated as appropriate: allergies, current medications, past family history, past medical history, past social history, past surgical history and problem list. Problem list updated.  Objective:   Vitals:   03/24/20 1025  BP: 117/67  Pulse: 81  Weight: 191 lb (86.6 kg)    Fetal Status:     Movement: Present     General:  Alert, oriented and cooperative. Patient is in no acute distress.  Skin: Skin is warm and dry. No rash noted.   Cardiovascular: Normal heart rate noted  Respiratory: Normal respiratory effort, no problems with respiration noted  Abdomen: Soft, gravid, appropriate for gestational age. Pain/Pressure: Absent     Pelvic:  Cervical exam deferred        Extremities: Normal range of motion.  Edema: Trace  Mental Status: Normal mood and affect. Normal behavior. Normal judgment and thought content.   Urinalysis:      Assessment and Plan:  Pregnancy: G2P1001 at [redacted]w[redacted]d  1. Supervision of other normal pregnancy, antepartum   Preterm labor symptoms and general obstetric precautions including but not limited to vaginal bleeding, contractions, leaking of fluid and fetal movement were reviewed in detail with the patient. Please refer to After Visit Summary for other counseling recommendations.   Return in about 2 weeks (around 04/07/2020) for ROB.   Brock Bad, MD  03/24/20

## 2020-04-07 ENCOUNTER — Other Ambulatory Visit: Payer: Self-pay

## 2020-04-07 ENCOUNTER — Other Ambulatory Visit (HOSPITAL_COMMUNITY)
Admission: RE | Admit: 2020-04-07 | Discharge: 2020-04-07 | Disposition: A | Discharge status: Home / Self Care | Payer: Self-pay | Source: Ambulatory Visit | Attending: Obstetrics and Gynecology | Admitting: Obstetrics and Gynecology

## 2020-04-07 ENCOUNTER — Encounter: Payer: Self-pay | Admitting: Obstetrics and Gynecology

## 2020-04-07 ENCOUNTER — Ambulatory Visit (INDEPENDENT_AMBULATORY_CARE_PROVIDER_SITE_OTHER): Payer: Self-pay | Admitting: Obstetrics and Gynecology

## 2020-04-07 VITALS — BP 121/77 | HR 74 | Wt 192.0 lb

## 2020-04-07 DIAGNOSIS — R768 Other specified abnormal immunological findings in serum: Secondary | ICD-10-CM

## 2020-04-07 DIAGNOSIS — Z3480 Encounter for supervision of other normal pregnancy, unspecified trimester: Secondary | ICD-10-CM

## 2020-04-07 DIAGNOSIS — Z3483 Encounter for supervision of other normal pregnancy, third trimester: Secondary | ICD-10-CM | POA: Diagnosis present

## 2020-04-07 DIAGNOSIS — Z3A36 36 weeks gestation of pregnancy: Secondary | ICD-10-CM | POA: Insufficient documentation

## 2020-04-07 NOTE — Progress Notes (Signed)
   PRENATAL VISIT NOTE  Subjective:  Brenda Mcneil is a 30 y.o. G2P1001 at [redacted]w[redacted]d being seen today for ongoing prenatal care.  She is currently monitored for the following issues for this low-risk pregnancy and has Supervision of other normal pregnancy, antepartum; Biological false positive RPR test; and [redacted] weeks gestation of pregnancy on their problem list.  Patient reports no complaints.  Contractions: Not present. Vag. Bleeding: None.  Movement: Present. Denies leaking of fluid.   The following portions of the patient's history were reviewed and updated as appropriate: allergies, current medications, past family history, past medical history, past social history, past surgical history and problem list.   Objective:   Vitals:   04/07/20 1057  BP: 121/77  Pulse: 74  Weight: 192 lb (87.1 kg)    Fetal Status: Fetal Heart Rate (bpm): 128 Fundal Height: 37 cm Movement: Present  Presentation: Vertex  General:  Alert, oriented and cooperative. Patient is in no acute distress.  Skin: Skin is warm and dry. No rash noted.   Cardiovascular: Normal heart rate noted  Respiratory: Normal respiratory effort, no problems with respiration noted  Abdomen: Soft, gravid, appropriate for gestational age.  Pain/Pressure: Absent     Pelvic: Cervical exam deferred        Extremities: Normal range of motion.  Edema: None  Mental Status: Normal mood and affect. Normal behavior. Normal judgment and thought content.   Assessment and Plan:  Pregnancy: G2P1001 at [redacted]w[redacted]d 1. Encounter for supervision of other normal pregnancy in third trimester: No red flag signs/symptoms. +FHTs and +fetal movement. Normal BP and appropriate fundal height. Normal anatomy scan at [place] and h/o term vaginal delivery x1. Vertex confirmed on bedside ultrasound. - Culture, beta strep (group b only) - Cervicovaginal ancillary only( [place]) - rtc 1 week or sooner as needed for return precautions as noted  2. [redacted] weeks gestation  of pregnancy, Supervision of other normal pregnancy, antepartum: - as noted above  3. Biological false positive RPR test: +RPR on 28 week labs. Follow-up Tpal negative and titer 1:1. - plan to repeat RPR on admission to L&D  Preterm labor symptoms and general obstetric precautions including but not limited to vaginal bleeding, contractions, leaking of fluid and fetal movement were reviewed in detail with the patient. Please refer to After Visit Summary for other counseling recommendations.   Return in about 1 week (around 04/14/2020) for f/u prenatal in person, any provider.  Future Appointments  Date Time Provider Department Center  04/14/2020  8:15 AM Nugent, Odie Sera, NP [facility] None    Sheila Oats, MD

## 2020-04-07 NOTE — Patient Instructions (Addendum)
-We recommend that you go to the health department to receive your Tdap and flu vaccines. -We did your test for group b strep today. If positive, you will receive antibiotics in labor to protect baby from serious infection.  [email or web address]">  Third Trimester of Pregnancy  The third trimester of pregnancy is from week 28 through week 40. This is months 7 through 9. The third trimester is a time when the unborn baby (fetus) is growing rapidly. At the end of the ninth month, the fetus is about 20 inches [place] and weighs 6-10 pounds. Body changes during your third trimester During the third trimester, your body will continue to go through many changes. The changes vary and generally return to normal after your baby is born. Physical changes  Your weight will continue to increase. You can expect to gain 25-35 pounds (11-16 kg) by the end of the pregnancy if you begin pregnancy at a normal weight. If you are underweight, you can expect to gain 28-40 lb (about 13-18 kg), and if you are overweight, you can expect to gain 15-25 lb (about 7-11 kg).  You may begin to get stretch marks on your hips, abdomen, and breasts.  Your breasts will continue to grow and may hurt. A yellow fluid (colostrum) may leak from your breasts. This is the first milk you are producing for your baby.  You may have changes in your hair. These can include thickening of your hair, rapid growth, and changes in texture. Some people also have hair loss during or after pregnancy, or hair that feels dry or thin.  Your belly button may stick out.  You may notice more swelling in your hands, face, or ankles. Health changes  You may have heartburn.  You may have constipation.  You may develop hemorrhoids.  You may develop swollen, bulging veins (varicose veins) in your legs.  You may have increased body aches in the pelvis, back, or thighs. This is due to weight gain and  increased hormones that are relaxing your joints.  You may have increased tingling or numbness in your hands, arms, and legs. The skin on your abdomen may also feel numb.  You may feel short of breath because of your expanding uterus. Other changes  You may urinate more often because the fetus is moving lower into your pelvis and pressing on your bladder.  You may have more problems sleeping. This may be caused by the size of your abdomen, an increased need to urinate, and an increase in your body's metabolism.  You may notice the fetus "dropping," or moving lower in your abdomen (lightening).  You may have increased vaginal discharge.  You may notice that you have pain around your pelvic bone as your uterus distends. Follow these instructions at home: Medicines  Follow your health care provider's instructions regarding medicine use. Specific medicines may be either safe or unsafe to take during pregnancy. Do not take any medicines unless approved by your health care provider.  Take a prenatal vitamin that contains at least 600 micrograms (mcg) of folic acid. Eating and drinking  Eat a healthy diet that includes fresh fruits and vegetables, whole grains, good sources of protein such as meat, eggs, or tofu, and low-fat dairy products.  Avoid raw meat and unpasteurized juice, milk, and cheese. These carry germs that can harm you and your baby.  Eat 4 or 5 small meals rather than 3 large meals a day.  You may need to take these actions to prevent  or treat constipation: ? Drink enough fluid to keep your urine pale yellow. ? Eat foods that are high in fiber, such as beans, whole grains, and fresh fruits and vegetables. ? Limit foods that are high in fat and processed sugars, such as fried or sweet foods. Activity  Exercise only as directed by your health care provider. Most people can continue their usual exercise routine during pregnancy. Try to exercise for 30 minutes at least 5 days  a week. Stop exercising if you experience contractions in the uterus.  Stop exercising if you develop pain or cramping in the lower abdomen or lower back.  Avoid heavy lifting.  Do not exercise if it is very hot or humid or if you are at a high altitude.  If you choose to, you may continue to have sex unless your health care provider tells you not to. Relieving pain and discomfort  Take frequent breaks and rest with your legs raised (elevated) if you have leg cramps or low back pain.  Take warm sitz baths to soothe any pain or discomfort caused by hemorrhoids. Use hemorrhoid cream if your health care provider approves.  Wear a supportive bra to prevent discomfort from breast tenderness.  If you develop varicose veins: ? Wear support hose as told by your health care provider. ? Elevate your feet for 15 minutes, 3-4 times a day. ? Limit salt in your diet. Safety  Talk to your health care provider before traveling far distances.  Do not use hot tubs, steam rooms, or saunas.  Wear your seat belt at all times when driving or riding in a car.  Talk with your health care provider if someone is verbally or physically abusive to you. Preparing for birth To prepare for the arrival of your baby:  Take prenatal classes to understand, practice, and ask questions about labor and delivery.  Visit the hospital and tour the maternity area.  Purchase a rear-facing car seat and make sure you know how to install it in your car.  Prepare the baby's room or sleeping area. Make sure to remove all pillows and stuffed animals from the baby's crib to prevent suffocation. General instructions  Avoid cat litter boxes and soil used by cats. These carry germs that can cause birth defects in the baby. If you have a cat, ask someone to clean the litter box for you.  Do not douche or use tampons. Do not use scented sanitary pads.  Do not use any products that contain nicotine or tobacco, such as  cigarettes, e-cigarettes, and chewing tobacco. If you need help quitting, ask your health care provider.  Do not use any herbal remedies, illegal drugs, or medicines that were not prescribed to you. Chemicals in these products can harm your baby.  Do not drink alcohol.  You will have more frequent prenatal exams during the third trimester. During a routine prenatal visit, your health care provider will do a physical exam, perform tests, and discuss your overall health. Keep all follow-up visits. This is important. Where to find more information  American Pregnancy Association: americanpregnancy.org  [employer]necologists: https://www.todd-brady.net/  Office on [employer] Health: [employer].co.nz Contact a health care provider if you have:  A fever.  Mild pelvic cramps, pelvic pressure, or nagging pain in your abdominal area or lower back.  Vomiting or diarrhea.  Bad-smelling vaginal discharge or foul-smelling urine.  Pain when you urinate.  A headache that does not go away when you take medicine.  Visual changes or see spots in front of your eyes. Get help right away if:  Your water breaks.  You have regular contractions less than 5 minutes apart.  You have spotting or bleeding from your vagina.  You have severe abdominal pain.  You have difficulty breathing.  You have chest pain.  You have fainting spells.  You have not felt your baby move for the time period told by your health care provider.  You have new or increased pain, swelling, or redness in an arm or leg. Summary  The third trimester of pregnancy is from week 28 through week 40 (months 7 through 9).  You may have more problems sleeping. This can be caused by the size of your abdomen, an increased need to urinate, and an increase in your body's metabolism.  You will have more frequent prenatal exams during the third trimester. Keep all follow-up visits. This  is important. This information is not intended to replace advice given to you by your health care provider. Make sure you discuss any questions you have with your health care provider. Document Revised: 06/25/2019 Document Reviewed: 05/01/2019 Elsevier Patient Education  2021 Elsevier Inc.   Fetal Movement Counts Patient Name: ________________________________________________ Patient Due Date: ____________________  What is a fetal movement count? A fetal movement count is the number of times that you feel your baby move during a certain amount of time. This may also be called a fetal kick count. A fetal movement count is recommended for every pregnant woman. You may be asked to start counting fetal movements as early as week 28 of your pregnancy. Pay attention to when your baby is most active. You may notice your baby's sleep and wake cycles. You may also notice things that make your baby move more. You should do a fetal movement count:  When your baby is normally most active.  At the same time each day. A good time to count movements is while you are resting, after having something to eat and drink. How do I count fetal movements? 1. Find a quiet, comfortable area. Sit, or lie down on your side. 2. Write down the date, the start time and stop time, and the number of movements that you felt between those two times. Take this information with you to your health care visits. 3. Write down your start time when you feel the first movement. 4. Count kicks, flutters, swishes, rolls, and jabs. You should feel at least 10 movements. 5. You may stop counting after you have felt 10 movements, or if you have been counting for 2 hours. Write down the stop time. 6. If you do not feel 10 movements in 2 hours, contact your health care provider for further instructions. Your health care provider may want to do additional tests to assess your baby's well-being. Contact a health care provider if:  You feel  fewer than 10 movements in 2 hours.  Your baby is not moving like he or she usually does. Date: ____________ Start time: ____________ Stop time: ____________ Movements: ____________ Date: ____________ Start time: ____________ Stop time: ____________ Movements: ____________ Date: ____________ Start time: ____________ Stop time: ____________ Movements: ____________ Date: ____________ Start time: ____________ Stop time: ____________ Movements: ____________ Date: ____________ Start time: ____________ Stop time: ____________ Movements: ____________ Date: ____________ Start time: ____________ Stop time: ____________ Movements: ____________ Date: ____________ Start time: ____________ Stop time: ____________ Movements: ____________ Date: ____________ Start time: ____________ Stop time: ____________ Movements: ____________ Date: ____________ Start time: ____________ Stop time:  ____________ Movements: ____________ This information is not intended to replace advice given to you by your health care provider. Make sure you discuss any questions you have with your health care provider. Document Revised: 09/05/2018 Document Reviewed: 09/05/2018 Elsevier Patient Education  2021 [employer].

## 2020-04-08 LAB — CERVICOVAGINAL ANCILLARY ONLY
Chlamydia: NEGATIVE
Comment: NEGATIVE
Comment: NORMAL
Neisseria Gonorrhea: NEGATIVE

## 2020-04-11 LAB — CULTURE, BETA STREP (GROUP B ONLY): Strep Gp B Culture: NEGATIVE

## 2020-04-14 ENCOUNTER — Other Ambulatory Visit: Payer: Self-pay

## 2020-04-14 ENCOUNTER — Ambulatory Visit (INDEPENDENT_AMBULATORY_CARE_PROVIDER_SITE_OTHER): Payer: Self-pay | Admitting: Women's Health

## 2020-04-14 VITALS — BP 135/80 | HR 78 | Wt 196.0 lb

## 2020-04-14 DIAGNOSIS — Z3A37 37 weeks gestation of pregnancy: Secondary | ICD-10-CM

## 2020-04-14 DIAGNOSIS — Z3480 Encounter for supervision of other normal pregnancy, unspecified trimester: Secondary | ICD-10-CM

## 2020-04-14 DIAGNOSIS — R768 Other specified abnormal immunological findings in serum: Secondary | ICD-10-CM

## 2020-04-14 NOTE — Progress Notes (Signed)
Subjective:  Brenda Mcneil is a 30 y.o. G2P1001 at [redacted]w[redacted]d being seen today for ongoing prenatal care.  She is currently monitored for the following issues for this low-risk pregnancy and has Supervision of other normal pregnancy, antepartum and Biological false positive RPR test on their problem list.  Patient reports no complaints.  Contractions: Not present. Vag. Bleeding: None.  Movement: Present. Denies leaking of fluid.   The following portions of the patient's history were reviewed and updated as appropriate: allergies, current medications, past family history, past medical history, past social history, past surgical history and problem list. Problem list updated.  Objective:   Vitals:   04/14/20 0815  BP: 135/80  Pulse: 78  Weight: 196 lb (88.9 kg)    Fetal Status:   Fundal Height: 38 cm Movement: Present     General:  Alert, oriented and cooperative. Patient is in no acute distress.  Skin: Skin is warm and dry. No rash noted.   Cardiovascular: Normal heart rate noted  Respiratory: Normal respiratory effort, no problems with respiration noted  Abdomen: Soft, gravid, appropriate for gestational age. Pain/Pressure: Absent     Pelvic: Vag. Bleeding: None     Cervical exam deferred        Extremities: Normal range of motion.  Edema: Trace  Mental Status: Normal mood and affect. Normal behavior. Normal judgment and thought content.   Urinalysis:      Assessment and Plan:  Pregnancy: G2P1001 at [redacted]w[redacted]d  1. Supervision of other normal pregnancy, antepartum -no concerns  2. Biological false positive RPR test - RPR  3. [redacted] weeks gestation of pregnancy  Term labor symptoms and general obstetric precautions including but not limited to vaginal bleeding, contractions, leaking of fluid and fetal movement were reviewed in detail with the patient. I discussed the assessment and treatment plan with the patient. The patient was provided an opportunity to ask questions and all were  answered. The patient agreed with the plan and demonstrated an understanding of the instructions. The patient was advised to call back or seek an in-person office evaluation/go to [facility]U at [facility] for any urgent or concerning symptoms. Please refer to After Visit Summary for other counseling recommendations.  Return in about 1 week (around 04/21/2020) for in-person LOB/APP OK.   Viyan Rosamond, Odie Sera, NP

## 2020-04-14 NOTE — Patient Instructions (Signed)
Maternity Assessment Unit (MAU)  The Maternity Assessment Unit (MAU) is located at the [facility] at [facility]. The address is: [address], [place], [place], [place] [address]. Please see map below for additional directions.    The Maternity Assessment Unit is designed to help you during your pregnancy, and for up to 6 weeks after delivery, with any pregnancy- or postpartum-related emergencies, if you think you are in labor, or if your water has broken. For example, if you experience nausea and vomiting, vaginal bleeding, severe abdominal or pelvic pain, elevated blood pressure or other problems related to your pregnancy or postpartum time, please come to the Maternity Assessment Unit for assistance.        [email or web address]">  Third Trimester of Pregnancy  The third trimester of pregnancy is from week 28 through week 40. This is months 7 through 9. The third trimester is a time when the unborn baby (fetus) is growing rapidly. At the end of the ninth month, the fetus is about 20 inches [place] and weighs 6-10 pounds. Body changes during your third trimester During the third trimester, your body will continue to go through many changes. The changes vary and generally return to normal after your baby is born. Physical changes  Your weight will continue to increase. You can expect to gain 25-35 pounds (11-16 kg) by the end of the pregnancy if you begin pregnancy at a normal weight. If you are underweight, you can expect to gain 28-40 lb (about 13-18 kg), and if you are overweight, you can expect to gain 15-25 lb (about 7-11 kg).  You may begin to get stretch marks on your hips, abdomen, and breasts.  Your breasts will continue to grow and may hurt. A yellow fluid (colostrum) may leak from your breasts. This is the first milk you are producing for your baby.  You may have changes in your hair. These  can include thickening of your hair, rapid growth, and changes in texture. Some people also have hair loss during or after pregnancy, or hair that feels dry or thin.  Your belly button may stick out.  You may notice more swelling in your hands, face, or ankles. Health changes  You may have heartburn.  You may have constipation.  You may develop hemorrhoids.  You may develop swollen, bulging veins (varicose veins) in your legs.  You may have increased body aches in the pelvis, back, or thighs. This is due to weight gain and increased hormones that are relaxing your joints.  You may have increased tingling or numbness in your hands, arms, and legs. The skin on your abdomen may also feel numb.  You may feel short of breath because of your expanding uterus. Other changes  You may urinate more often because the fetus is moving lower into your pelvis and pressing on your bladder.  You may have more problems sleeping. This may be caused by the size of your abdomen, an increased need to urinate, and an increase in your body's metabolism.  You may notice the fetus "dropping," or moving lower in your abdomen (lightening).  You may have increased vaginal discharge.  You may notice that you have pain around your pelvic bone as your uterus distends. Follow these instructions at home: Medicines  Follow your health care provider's instructions regarding medicine use. Specific medicines may be either safe or unsafe to take during pregnancy. Do not take any medicines unless approved by your health care provider.  Take  a prenatal vitamin that contains at least 600 micrograms (mcg) of folic acid. Eating and drinking  Eat a healthy diet that includes fresh fruits and vegetables, whole grains, good sources of protein such as meat, eggs, or tofu, and low-fat dairy products.  Avoid raw meat and unpasteurized juice, milk, and cheese. These carry germs that can harm you and your baby.  Eat 4 or 5  small meals rather than 3 large meals a day.  You may need to take these actions to prevent or treat constipation: ? Drink enough fluid to keep your urine pale yellow. ? Eat foods that are high in fiber, such as beans, whole grains, and fresh fruits and vegetables. ? Limit foods that are high in fat and processed sugars, such as fried or sweet foods. Activity  Exercise only as directed by your health care provider. Most people can continue their usual exercise routine during pregnancy. Try to exercise for 30 minutes at least 5 days a week. Stop exercising if you experience contractions in the uterus.  Stop exercising if you develop pain or cramping in the lower abdomen or lower back.  Avoid heavy lifting.  Do not exercise if it is very hot or humid or if you are at a high altitude.  If you choose to, you may continue to have sex unless your health care provider tells you not to. Relieving pain and discomfort  Take frequent breaks and rest with your legs raised (elevated) if you have leg cramps or low back pain.  Take warm sitz baths to soothe any pain or discomfort caused by hemorrhoids. Use hemorrhoid cream if your health care provider approves.  Wear a supportive bra to prevent discomfort from breast tenderness.  If you develop varicose veins: ? Wear support hose as told by your health care provider. ? Elevate your feet for 15 minutes, 3-4 times a day. ? Limit salt in your diet. Safety  Talk to your health care provider before traveling far distances.  Do not use hot tubs, steam rooms, or saunas.  Wear your seat belt at all times when driving or riding in a car.  Talk with your health care provider if someone is verbally or physically abusive to you. Preparing for birth To prepare for the arrival of your baby:  Take prenatal classes to understand, practice, and ask questions about labor and delivery.  Visit the hospital and tour the maternity area.  Purchase a  rear-facing car seat and make sure you know how to install it in your car.  Prepare the baby's room or sleeping area. Make sure to remove all pillows and stuffed animals from the baby's crib to prevent suffocation. General instructions  Avoid cat litter boxes and soil used by cats. These carry germs that can cause birth defects in the baby. If you have a cat, ask someone to clean the litter box for you.  Do not douche or use tampons. Do not use scented sanitary pads.  Do not use any products that contain nicotine or tobacco, such as cigarettes, e-cigarettes, and chewing tobacco. If you need help quitting, ask your health care provider.  Do not use any herbal remedies, illegal drugs, or medicines that were not prescribed to you. Chemicals in these products can harm your baby.  Do not drink alcohol.  You will have more frequent prenatal exams during the third trimester. During a routine prenatal visit, your health care provider will do a physical exam, perform tests, and discuss your overall health.  Keep all follow-up visits. This is important. Where to find more information  American Pregnancy Association: americanpregnancy.org  [employer]: https://www.todd-brady.net/  Office on [employer] Health: MightyReward.co.nz Contact a health care provider if you have:  A fever.  Mild pelvic cramps, pelvic pressure, or nagging pain in your abdominal area or lower back.  Vomiting or diarrhea.  Bad-smelling vaginal discharge or foul-smelling urine.  Pain when you urinate.  A headache that does not go away when you take medicine.  Visual changes or see spots in front of your eyes. Get help right away if:  Your water breaks.  You have regular contractions less than 5 minutes apart.  You have spotting or bleeding from your vagina.  You have severe abdominal pain.  You have difficulty breathing.  You have chest pain.  You have  fainting spells.  You have not felt your baby move for the time period told by your health care provider.  You have new or increased pain, swelling, or redness in an arm or leg. Summary  The third trimester of pregnancy is from week 28 through week 40 (months 7 through 9).  You may have more problems sleeping. This can be caused by the size of your abdomen, an increased need to urinate, and an increase in your body's metabolism.  You will have more frequent prenatal exams during the third trimester. Keep all follow-up visits. This is important. This information is not intended to replace advice given to you by your health care provider. Make sure you discuss any questions you have with your health care provider. Document Revised: 06/25/2019 Document Reviewed: 05/01/2019 Elsevier Patient Education  2021 Elsevier Inc.        Signs and Symptoms of Labor Labor is the body's natural process of moving the baby and the placenta out of the uterus. The process of labor usually starts when the baby is full-term, between 2 and 40 weeks of pregnancy. Signs and symptoms that you are close to going into labor As your body prepares for labor and the birth of your baby, you may notice the following symptoms in the weeks and days before true labor starts:  Passing a small amount of thick, bloody mucus from your vagina. This is called normal bloody show or losing your mucus plug. This may happen more than a week before labor begins, or right before labor begins, as the opening of the cervix starts to widen (dilate). For some women, the entire mucus plug passes at once. For others, pieces of the mucus plug may gradually pass over several days.  Your baby moving (dropping) lower in your pelvis to get into position for birth (lightening). When this happens, you may feel more pressure on your bladder and pelvic bone and less pressure on your ribs. This may make it easier to breathe. It may also cause you to  need to urinate more often and have problems with bowel movements.  Having "practice contractions," also called Braxton Hicks contractions or false labor. These occur at irregular (unevenly spaced) intervals that are more than 10 minutes apart. False labor contractions are common after exercise or sexual activity. They will stop if you change position, rest, or drink fluids. These contractions are usually mild and do not get stronger over time. They may feel like: ? A backache or back pain. ? Mild cramps, similar to menstrual cramps. ? Tightening or pressure in your abdomen. Other early symptoms include:  Nausea or loss of appetite.  Diarrhea.  Having a sudden burst of energy, or feeling very tired.  Mood changes.  Having trouble sleeping.   Signs and symptoms that labor has begun Signs that you are in labor may include:  Having contractions that come at regular (evenly spaced) intervals and increase in intensity. This may feel like more intense tightening or pressure in your abdomen that moves to your back. ? Contractions may also feel like rhythmic pain in your upper thighs or back that comes and goes at regular intervals. ? For first-time mothers, this change in intensity of contractions often occurs at a more gradual pace. ? Women who have given birth before may notice a more rapid progression of contraction changes.  Feeling pressure in the vaginal area.  Your water breaking (rupture of membranes). This is when the sac of fluid that surrounds your baby breaks. Fluid leaking from your vagina may be clear or blood-tinged. Labor usually starts within 24 hours of your water breaking, but it may take longer to begin. ? Some women may feel a sudden gush of fluid. ? Others notice that their underwear repeatedly becomes damp. Follow these instructions at home:  When labor starts, or if your water breaks, call your health care provider or nurse care line. Based on your situation, they  will determine when you should go in for an exam.  During early labor, you may be able to rest and manage symptoms at home. Some strategies to try at home include: ? Breathing and relaxation techniques. ? Taking a warm bath or shower. ? Listening to music. ? Using a heating pad on the lower back for pain. If you are directed to use heat:  Place a towel between your skin and the heat source.  Leave the heat on for 20-30 minutes.  Remove the heat if your skin turns bright red. This is especially important if you are unable to feel pain, heat, or cold. You may have a greater risk of getting burned.   Contact a health care provider if:  Your labor has started.  Your water breaks. Get help right away if:  You have painful, regular contractions that are 5 minutes apart or less.  Labor starts before you are [redacted] weeks along in your pregnancy.  You have a fever.  You have bright red blood coming from your vagina.  You do not feel your baby moving.  You have a severe headache with or without vision problems.  You have severe nausea, vomiting, or diarrhea.  You have chest pain or shortness of breath. These symptoms may represent a serious problem that is an emergency. Do not wait to see if the symptoms will go away. Get medical help right away. Call your local emergency services (911 in the [place] Do not drive yourself to the hospital. Summary  Labor is your body's natural process of moving your baby and the placenta out of your uterus.  The process of labor usually starts when your baby is full-term, between 76 and 40 weeks of pregnancy.  When labor starts, or if your water breaks, call your health care provider or nurse care line. Based on your situation, they will determine when you should go in for an exam. This information is not intended to replace advice given to you by your health care provider. Make sure you discuss any questions you have with your health care  provider. Document Revised: 11/08/2019 Document Reviewed: 11/08/2019 Elsevier Patient Education  2021 [employer].

## 2020-04-15 LAB — RPR: RPR Ser Ql: NONREACTIVE

## 2020-04-21 ENCOUNTER — Ambulatory Visit (INDEPENDENT_AMBULATORY_CARE_PROVIDER_SITE_OTHER): Payer: Self-pay | Admitting: Obstetrics and Gynecology

## 2020-04-21 ENCOUNTER — Other Ambulatory Visit: Payer: Self-pay

## 2020-04-21 VITALS — BP 131/82 | HR 69 | Wt 197.0 lb

## 2020-04-21 DIAGNOSIS — Z3A38 38 weeks gestation of pregnancy: Secondary | ICD-10-CM | POA: Insufficient documentation

## 2020-04-21 DIAGNOSIS — R768 Other specified abnormal immunological findings in serum: Secondary | ICD-10-CM

## 2020-04-21 DIAGNOSIS — Z3480 Encounter for supervision of other normal pregnancy, unspecified trimester: Secondary | ICD-10-CM

## 2020-04-21 NOTE — Progress Notes (Signed)
ROB 38w  CC: Declines cervix Exam.

## 2020-04-21 NOTE — Progress Notes (Signed)
   PRENATAL VISIT NOTE  Subjective:  Brenda Mcneil is a 30 y.o. G2P1001 at [redacted]w[redacted]d being seen today for ongoing prenatal care.  She is currently monitored for the following issues for this low-risk pregnancy and has Supervision of other normal pregnancy, antepartum; Biological false positive RPR test; and [redacted] weeks gestation of pregnancy on their problem list.  Patient doing well with no acute concerns today. She reports no complaints.  Contractions: Not present. Vag. Bleeding: None.  Movement: Present. Denies leaking of fluid.   The following portions of the patient's history were reviewed and updated as appropriate: allergies, current medications, past family history, past medical history, past social history, past surgical history and problem list. Problem list updated.  Objective:   Vitals:   04/21/20 1021  BP: 131/82  Pulse: 69  Weight: 197 lb (89.4 kg)    Fetal Status: Fetal Heart Rate (bpm): 140   Movement: Present     General:  Alert, oriented and cooperative. Patient is in no acute distress.  Skin: Skin is warm and dry. No rash noted.   Cardiovascular: Normal heart rate noted  Respiratory: Normal respiratory effort, no problems with respiration noted  Abdomen: Soft, gravid, appropriate for gestational age.  Pain/Pressure: Absent     Pelvic: Cervical exam deferred        Extremities: Normal range of motion.  Edema: Trace  Mental Status:  Normal mood and affect. Normal behavior. Normal judgment and thought content.   Assessment and Plan:  Pregnancy: G2P1001 at [redacted]w[redacted]d  1. Supervision of other normal pregnancy, antepartum Consider cervical exam in one week, ? Elective IOL at 40 weeks or just beyond  2. [redacted] weeks gestation of pregnancy   3. Biological false positive RPR test   Term labor symptoms and general obstetric precautions including but not limited to vaginal bleeding, contractions, leaking of fluid and fetal movement were reviewed in detail with the patient.  Please  refer to After Visit Summary for other counseling recommendations.   Return in about 1 week (around 04/28/2020) for ROB, in person.   Mariel Aloe, MD Faculty Attending Center for [facility]

## 2020-04-28 ENCOUNTER — Encounter: Payer: Self-pay | Admitting: Obstetrics & Gynecology

## 2020-04-28 ENCOUNTER — Inpatient Hospital Stay (HOSPITAL_COMMUNITY): Payer: Medicaid Other | Admitting: Anesthesiology

## 2020-04-28 ENCOUNTER — Inpatient Hospital Stay (HOSPITAL_COMMUNITY)
Admission: AD | Admit: 2020-04-28 | Discharge: 2020-04-30 | DRG: 807 | Disposition: A | Discharge status: Home / Self Care | Payer: Medicaid Other | Attending: Obstetrics and Gynecology | Admitting: Obstetrics and Gynecology

## 2020-04-28 ENCOUNTER — Other Ambulatory Visit: Payer: Self-pay

## 2020-04-28 ENCOUNTER — Ambulatory Visit (INDEPENDENT_AMBULATORY_CARE_PROVIDER_SITE_OTHER): Payer: Self-pay | Admitting: Obstetrics & Gynecology

## 2020-04-28 ENCOUNTER — Other Ambulatory Visit: Payer: Self-pay | Admitting: Family Medicine

## 2020-04-28 ENCOUNTER — Encounter (HOSPITAL_COMMUNITY): Payer: Self-pay | Admitting: Family Medicine

## 2020-04-28 VITALS — BP 154/82 | HR 67 | Wt 197.0 lb

## 2020-04-28 DIAGNOSIS — Z20822 Contact with and (suspected) exposure to covid-19: Secondary | ICD-10-CM | POA: Diagnosis present

## 2020-04-28 DIAGNOSIS — O135 Gestational [pregnancy-induced] hypertension without significant proteinuria, complicating the puerperium: Secondary | ICD-10-CM | POA: Diagnosis not present

## 2020-04-28 DIAGNOSIS — Z3480 Encounter for supervision of other normal pregnancy, unspecified trimester: Secondary | ICD-10-CM

## 2020-04-28 DIAGNOSIS — Z3A39 39 weeks gestation of pregnancy: Secondary | ICD-10-CM | POA: Diagnosis not present

## 2020-04-28 DIAGNOSIS — R768 Other specified abnormal immunological findings in serum: Secondary | ICD-10-CM | POA: Diagnosis present

## 2020-04-28 DIAGNOSIS — O9903 Anemia complicating the puerperium: Secondary | ICD-10-CM | POA: Diagnosis not present

## 2020-04-28 DIAGNOSIS — O134 Gestational [pregnancy-induced] hypertension without significant proteinuria, complicating childbirth: Principal | ICD-10-CM | POA: Diagnosis present

## 2020-04-28 DIAGNOSIS — O139 Gestational [pregnancy-induced] hypertension without significant proteinuria, unspecified trimester: Secondary | ICD-10-CM | POA: Diagnosis present

## 2020-04-28 DIAGNOSIS — Z23 Encounter for immunization: Secondary | ICD-10-CM

## 2020-04-28 DIAGNOSIS — O9081 Anemia of the puerperium: Secondary | ICD-10-CM | POA: Diagnosis not present

## 2020-04-28 DIAGNOSIS — D649 Anemia, unspecified: Secondary | ICD-10-CM | POA: Diagnosis not present

## 2020-04-28 LAB — COMPREHENSIVE METABOLIC PANEL
ALT: 15 U/L (ref 0–44)
AST: 28 U/L (ref 15–41)
Albumin: 3 g/dL — ABNORMAL LOW (ref 3.5–5.0)
Alkaline Phosphatase: 148 U/L — ABNORMAL HIGH (ref 38–126)
Anion gap: 8 (ref 5–15)
BUN: 7 mg/dL (ref 6–20)
CO2: 21 mmol/L — ABNORMAL LOW (ref 22–32)
Calcium: 8.5 mg/dL — ABNORMAL LOW (ref 8.9–10.3)
Chloride: 108 mmol/L (ref 98–111)
Creatinine, Ser: 0.66 mg/dL (ref 0.44–1.00)
GFR, Estimated: >60 mL/min (ref >60)
Glucose, Bld: 74 mg/dL (ref 70–99)
Potassium: 3.6 mmol/L (ref 3.5–5.1)
Sodium: 137 mmol/L (ref 135–145)
Total Bilirubin: 0.5 mg/dL (ref 0.3–1.2)
Total Protein: 6.3 g/dL — ABNORMAL LOW (ref 6.5–8.1)

## 2020-04-28 LAB — CBC
HCT: 31.6 % — ABNORMAL LOW (ref 36.0–46.0)
HCT: 30.5 % — ABNORMAL LOW (ref 36.0–46.0)
HCT: 30.9 % — ABNORMAL LOW (ref 36.0–46.0)
Hemoglobin: 10.3 g/dL — ABNORMAL LOW (ref 12.0–15.0)
Hemoglobin: 10 g/dL — ABNORMAL LOW (ref 12.0–15.0)
Hemoglobin: 10.1 g/dL — ABNORMAL LOW (ref 12.0–15.0)
MCH: 26.7 pg (ref 26.0–34.0)
MCH: 26.9 pg (ref 26.0–34.0)
MCH: 26.8 pg (ref 26.0–34.0)
MCHC: 32.6 g/dL (ref 30.0–36.0)
MCHC: 32.8 g/dL (ref 30.0–36.0)
MCHC: 32.7 g/dL (ref 30.0–36.0)
MCV: 81.9 fL (ref 80.0–100.0)
MCV: 82 fL (ref 80.0–100.0)
MCV: 82 fL (ref 80.0–100.0)
Platelets: 139 10*3/uL — ABNORMAL LOW (ref 150–400)
Platelets: 143 10*3/uL — ABNORMAL LOW (ref 150–400)
Platelets: 139 10*3/uL — ABNORMAL LOW (ref 150–400)
RBC: 3.86 MIL/uL — ABNORMAL LOW (ref 3.87–5.11)
RBC: 3.72 MIL/uL — ABNORMAL LOW (ref 3.87–5.11)
RBC: 3.77 MIL/uL — ABNORMAL LOW (ref 3.87–5.11)
RDW: 14.2 % (ref 11.5–15.5)
RDW: 14 % (ref 11.5–15.5)
RDW: 14.2 % (ref 11.5–15.5)
WBC: 7.2 10*3/uL (ref 4.0–10.5)
WBC: 9 10*3/uL (ref 4.0–10.5)
WBC: 9.1 10*3/uL (ref 4.0–10.5)
nRBC: 0 % (ref 0.0–0.2)
nRBC: 0 % (ref 0.0–0.2)
nRBC: 0 % (ref 0.0–0.2)

## 2020-04-28 LAB — RESP PANEL BY RT-PCR (FLU A&B, COVID) ARPGX2
Influenza A by PCR: NEGATIVE
Influenza B by PCR: NEGATIVE
SARS Coronavirus 2 by RT PCR: NEGATIVE

## 2020-04-28 LAB — TYPE AND SCREEN
ABO/RH(D): B POS
Antibody Screen: NEGATIVE

## 2020-04-28 LAB — PROTEIN / CREATININE RATIO, URINE
Creatinine, Urine: 30.63 mg/dL
Total Protein, Urine: <6 mg/dL

## 2020-04-28 MED ORDER — DIPHENHYDRAMINE HCL 50 MG/ML IJ SOLN
12.5000 mg | INTRAMUSCULAR | Status: DC | PRN
Start: 2020-04-28 — End: 2020-04-28

## 2020-04-28 MED ORDER — EPHEDRINE 5 MG/ML INJ: 10.0000 mg | INTRAVENOUS | Status: DC | PRN

## 2020-04-28 MED ORDER — LIDOCAINE HCL (PF) 1 % IJ SOLN: 30.0000 mL | INTRAMUSCULAR | Status: DC | PRN

## 2020-04-28 MED ORDER — FENTANYL-BUPIVACAINE-NACL 0.5-0.125-0.9 MG/250ML-% EP SOLN
12.0000 mL/h | EPIDURAL | Status: DC | PRN
  Filled 2020-04-28: qty 250

## 2020-04-28 MED ORDER — LACTATED RINGERS IV SOLN: INTRAVENOUS | Status: DC

## 2020-04-28 MED ORDER — SOD CITRATE-CITRIC ACID 500-334 MG/5ML PO SOLN: 30.0000 mL | ORAL | Status: DC | PRN

## 2020-04-28 MED ORDER — PHENYLEPHRINE 40 MCG/ML (10ML) SYRINGE FOR IV PUSH (FOR BLOOD PRESSURE SUPPORT): 80.0000 ug | PREFILLED_SYRINGE | INTRAVENOUS | Status: DC | PRN

## 2020-04-28 MED ORDER — TERBUTALINE SULFATE 1 MG/ML IJ SOLN: 0.2500 mg | Freq: Once | INTRAMUSCULAR | Status: DC | PRN

## 2020-04-28 MED ORDER — SENNOSIDES-DOCUSATE SODIUM 8.6-50 MG PO TABS
2.0000 | ORAL_TABLET | ORAL | Status: DC
  Administered 2020-04-28 – 2020-04-29 (×2): 2 via ORAL
  Filled 2020-04-28 (×2): qty 2

## 2020-04-28 MED ORDER — LIDOCAINE HCL (PF) 1 % IJ SOLN
INTRAMUSCULAR | Status: DC | PRN
  Administered 2020-04-28: 2 mL via EPIDURAL
  Administered 2020-04-28: 10 mL via EPIDURAL

## 2020-04-28 MED ORDER — DIPHENHYDRAMINE HCL 25 MG PO CAPS: 25.0000 mg | ORAL_CAPSULE | Freq: Four times a day (QID) | ORAL | Status: DC | PRN

## 2020-04-28 MED ORDER — ONDANSETRON HCL 4 MG/2ML IJ SOLN: 4.0000 mg | INTRAMUSCULAR | Status: DC | PRN

## 2020-04-28 MED ORDER — BENZOCAINE-MENTHOL 20-0.5 % EX AERO: 1.0000 "application " | INHALATION_SPRAY | CUTANEOUS | Status: DC | PRN

## 2020-04-28 MED ORDER — ACETAMINOPHEN 325 MG PO TABS: 650.0000 mg | ORAL_TABLET | ORAL | Status: DC | PRN

## 2020-04-28 MED ORDER — FENTANYL-BUPIVACAINE-NACL 0.5-0.125-0.9 MG/250ML-% EP SOLN
EPIDURAL | Status: DC | PRN
  Administered 2020-04-28: 12 mL/h via EPIDURAL

## 2020-04-28 MED ORDER — ZOLPIDEM TARTRATE 5 MG PO TABS: 5.0000 mg | ORAL_TABLET | Freq: Every evening | ORAL | Status: DC | PRN

## 2020-04-28 MED ORDER — DIBUCAINE (PERIANAL) 1 % EX OINT: 1.0000 "application " | TOPICAL_OINTMENT | CUTANEOUS | Status: DC | PRN

## 2020-04-28 MED ORDER — OXYTOCIN BOLUS FROM INFUSION
333.0000 mL | Freq: Once | INTRAVENOUS | Status: AC
  Administered 2020-04-28: 333 mL via INTRAVENOUS

## 2020-04-28 MED ORDER — SIMETHICONE 80 MG PO CHEW: 80.0000 mg | CHEWABLE_TABLET | ORAL | Status: DC | PRN

## 2020-04-28 MED ORDER — WITCH HAZEL-GLYCERIN EX PADS: 1.0000 "application " | MEDICATED_PAD | CUTANEOUS | Status: DC | PRN

## 2020-04-28 MED ORDER — ONDANSETRON HCL 4 MG PO TABS: 4.0000 mg | ORAL_TABLET | ORAL | Status: DC | PRN

## 2020-04-28 MED ORDER — IBUPROFEN 600 MG PO TABS
600.0000 mg | ORAL_TABLET | Freq: Four times a day (QID) | ORAL | Status: DC
  Administered 2020-04-28 – 2020-04-30 (×7): 600 mg via ORAL
  Filled 2020-04-28 (×7): qty 1

## 2020-04-28 MED ORDER — OXYTOCIN-SODIUM CHLORIDE 30-0.9 UT/500ML-% IV SOLN
1.0000 m[IU]/min | INTRAVENOUS | Status: DC
  Administered 2020-04-28: 2 m[IU]/min via INTRAVENOUS
  Filled 2020-04-28: qty 500

## 2020-04-28 MED ORDER — TETANUS-DIPHTH-ACELL PERTUSSIS 5-2.5-18.5 LF-MCG/0.5 IM SUSY
0.5000 mL | PREFILLED_SYRINGE | Freq: Once | INTRAMUSCULAR | Status: AC
  Administered 2020-04-29: 0.5 mL via INTRAMUSCULAR
  Filled 2020-04-28: qty 0.5

## 2020-04-28 MED ORDER — LACTATED RINGERS IV SOLN: 500.0000 mL | Freq: Once | INTRAVENOUS | Status: DC

## 2020-04-28 MED ORDER — COCONUT OIL OIL: 1.0000 "application " | TOPICAL_OIL | Status: DC | PRN

## 2020-04-28 MED ORDER — ONDANSETRON HCL 4 MG/2ML IJ SOLN: 4.0000 mg | Freq: Four times a day (QID) | INTRAMUSCULAR | Status: DC | PRN

## 2020-04-28 MED ORDER — LACTATED RINGERS IV SOLN: 500.0000 mL | INTRAVENOUS | Status: DC | PRN

## 2020-04-28 MED ORDER — OXYTOCIN-SODIUM CHLORIDE 30-0.9 UT/500ML-% IV SOLN
2.5000 [IU]/h | INTRAVENOUS | Status: DC
  Administered 2020-04-28: 2.5 [IU]/h via INTRAVENOUS

## 2020-04-28 MED ORDER — PRENATAL MULTIVITAMIN CH
1.0000 | ORAL_TABLET | Freq: Every day | ORAL | Status: DC
  Administered 2020-04-29 – 2020-04-30 (×2): 1 via ORAL
  Filled 2020-04-28 (×2): qty 1

## 2020-04-28 NOTE — Anesthesia Preprocedure Evaluation (Signed)
Anesthesia Evaluation  Patient identified by MRN, date of birth, ID band Patient awake    Reviewed: Allergy & Precautions, Patient's Chart, lab work & pertinent test results  Airway Mallampati: II  TM Distance: >3 FB Neck ROM: Full    Dental no notable dental hx.    Pulmonary neg pulmonary ROS,    Pulmonary exam normal breath sounds clear to auscultation       Cardiovascular hypertension (gest HTN), Normal cardiovascular exam Rhythm:Regular Rate:Normal     Neuro/Psych negative neurological ROS  negative psych ROS   GI/Hepatic negative GI ROS, Neg liver ROS,   Endo/Other  negative endocrine ROS  Renal/GU negative Renal ROS  negative genitourinary   Musculoskeletal negative musculoskeletal ROS (+)   Abdominal   Peds negative pediatric ROS (+)  Hematology  (+) Blood dyscrasia, anemia , hct 30.5, plt 143   Anesthesia Other Findings   Reproductive/Obstetrics (+) Pregnancy                             Anesthesia Physical Anesthesia Plan  ASA: II and emergent  Anesthesia Plan: Epidural   Post-op Pain Management:    Induction:   PONV Risk Score and Plan: 2  Airway Management Planned: Natural Airway  Additional Equipment: None  Intra-op Plan:   Post-operative Plan:   Informed Consent: I have reviewed the patients History and Physical, chart, labs and discussed the procedure including the risks, benefits and alternatives for the proposed anesthesia with the patient or authorized representative who has indicated his/her understanding and acceptance.       Plan Discussed with:   Anesthesia Plan Comments:         Anesthesia Quick Evaluation

## 2020-04-28 NOTE — Progress Notes (Signed)
   PRENATAL VISIT NOTE  Subjective:  Brenda Mcneil is a 30 y.o. G2P1001 at [redacted]w[redacted]d being seen today for ongoing prenatal care.  She is currently monitored for the following issues for this high-risk pregnancy and has Supervision of other normal pregnancy, antepartum; Biological false positive RPR test; and [redacted] weeks gestation of pregnancy on their problem list.  Patient reports bleeding and SPB 160 at home, DBP 90.  Contractions: Not present. Vag. Bleeding: Scant.  Movement: Present. Denies leaking of fluid.   The following portions of the patient's history were reviewed and updated as appropriate: allergies, current medications, past family history, past medical history, past social history, past surgical history and problem list.   Objective:   Vitals:   04/28/20 1009  BP: (!) 154/82  Pulse: 67  Weight: 197 lb (89.4 kg)    Fetal Status:     Movement: Present  Presentation: Vertex  General:  Alert, oriented and cooperative. Patient is in no acute distress.  Skin: Skin is warm and dry. No rash noted.   Cardiovascular: Normal heart rate noted  Respiratory: Normal respiratory effort, no problems with respiration noted  Abdomen: Soft, gravid, appropriate for gestational age.  Pain/Pressure: Absent     Pelvic: Cervical exam performed in the presence of a chaperone Dilation: 2.5 Effacement (%): 50 Station: -3  Extremities: Normal range of motion.  Edema: Trace  Mental Status: Normal mood and affect. Normal behavior. Normal judgment and thought content.   Assessment and Plan:  Pregnancy: G2P1001 at [redacted]w[redacted]d There are no diagnoses linked to this encounter. Term labor symptoms and general obstetric precautions including but not limited to vaginal bleeding, contractions, leaking of fluid and fetal movement were reviewed in detail with the patient. Please refer to After Visit Summary for other counseling recommendations.   GHTN, 39 weeks, Dr Alvester Morin aware and pt will have direct admit to  L&D  Future Appointments  Date Time Provider Department Center  04/28/2020 11:15 AM Adam Phenix, MD [facility] None  05/05/2020  9:00 AM Hermina Staggers, MD [facility] None    Scheryl Darter, MD

## 2020-04-28 NOTE — H&P (Addendum)
OBSTETRIC ADMISSION HISTORY AND PHYSICAL  Brenda Mcneil is a 30 y.o. female G2P1001 with IUP at [redacted]w[redacted]d by L/14 presenting for IOL for gHTN. She reports +FMs, No LOF, no VB, no blurry vision, headaches or peripheral edema, and RUQ pain.  She plans on bottle feeding. She unsure about birth control.  She received her prenatal care at  [facility]    Dating: By L/14 --->  Estimated Date of Delivery: 05/05/20  Sono: @[redacted]w[redacted]d , CWD, normal anatomy, 341.7g, 76.2% EFW  Prenatal History/Complications:  - Newly diagnosed gHTN   Past Medical History: Past Medical History:  Diagnosis Date   Medical history non-contributory     Past Surgical History: Past Surgical History:  Procedure Laterality Date   ARTHROSCOPIC REPAIR ACL Left    KNEE ARTHROSCOPY W/ ACL RECONSTRUCTION      Obstetrical History: OB History     Gravida  2   Para  1   Term  1   Preterm  0   AB  0   Living  1      SAB  0   IAB  0   Ectopic  0   Multiple  0   Live Births  1           Social History Social History   Socioeconomic History   Marital status: Married    Spouse name: Not on file   Number of children: Not on file   Years of education: Not on file   Highest education level: Not on file  Occupational History   Not on file  Tobacco Use   Smoking status: Never Smoker   Smokeless tobacco: Never Used  Vaping Use   Vaping Use: Never used  Substance and Sexual Activity   Alcohol use: No   Drug use: No   Sexual activity: Yes    Birth control/protection: None  Other Topics Concern   Not on file  Social History Narrative   Not on file   Social Determinants of Health   Financial Resource Strain: Not on file  Food Insecurity: Not on file  Transportation Needs: Not on file  Physical Activity: Not on file  Stress: Not on file  Social Connections: Not on file    Family History: No family history on file.  Allergies: Allergies  Allergen Reactions   Vicodin [Hydrocodone-Acetaminophen]  Nausea And Vomiting    Medications Prior to Admission  Medication Sig Dispense Refill Last Dose   Prenatal Vit-Fe Fumarate-FA (PRENATAL MULTIVITAMIN) TABS tablet Take 1 tablet by mouth daily at 12 noon.   04/27/2020 at Unknown time   Blood Pressure Monitoring (BLOOD PRESSURE CUFF) MISC 1 Device by Does not apply route once a week. 1 each 0      Review of Systems   All systems reviewed and negative except as stated in HPI  Blood pressure (!) 142/87, pulse 64, temperature 98 F (36.7 C), temperature source Oral, resp. rate 16, height 5\' 9"  (1.753 m), weight 88.9 kg, last menstrual period 07/30/2019. General appearance: alert, cooperative, appears stated age and no distress Lungs: normal WOB Heart: regular rate  Abdomen: soft, non-tender Extremities: no sign of DVT DTR's normal Presentation: cephalic by leopolds  Fetal monitoringBaseline: 140s bpm, Variability: Good {> 6 bpm), Accelerations: Reactive and Decelerations: Absent Uterine activity irregular    Prenatal labs: ABO, Rh: --/--/PENDING (03/30 1125) Antibody: PENDING (03/30 1125) Rubella: 2.47 (09/28 1535) RPR: Non Reactive (03/16 0852)  HBsAg: Negative (09/28 1535)  HIV: Non Reactive (01/25 1032)  GBS: Negative/-- (  03/09 1225)  1 hr Glucola passed Genetic screening  Low risk  Anatomy US normal  Prenatal Transfer Tool  Maternal Diabetes: No Genetic Screening: Normal Maternal Ultrasounds/Referrals: Normal Fetal Ultrasounds or other Referrals:  None Maternal Substance Abuse:  No Significant Maternal Medications:  None Significant Maternal Lab Results: Group B Strep negative  Results for orders placed or performed during the hospital encounter of 04/28/20 (from the past 24 hour(s))  Type and screen   Collection Time: 04/28/20 11:25 AM  Result Value Ref Range   ABO/RH(D) PENDING    Antibody Screen PENDING    Sample Expiration      05/01/2020,2359 Performed at [facility], [address] [place],  [place] [address]     Patient Active Problem List   Diagnosis Date Noted   Gestational hypertension 04/28/2020   [redacted] weeks gestation of pregnancy 04/21/2020   Biological false positive RPR test 03/02/2020   Supervision of other normal pregnancy, antepartum 10/28/2019    Assessment/Plan:  Brenda Mcneil is a 30 y.o. G2P1001 at [redacted]w[redacted]d here for IOL for newly diagnosed gestational hypertension.  #Labor: Had Aber discussion regarding next steps and induction of labor. The patient would like to eat something because she skipped breakfast this morning. Will plan to start pitocin s/p pt's meal. #Pain: TBD per pt preference. #FWB: Category 1 strip #ID: GBS negative, false positive RPR in pregnancy #MOF: Bottlefeeding #MOC: Undecided at this time #Circ:  Yes-outpatient #gHTN: Most recent blood pressure 142/87. Not currently on any medications.  Pre-E labs pending at this time.  Denies any headache, changes in vision.  Reports her leg swelling is stable.  We will continue to monitor blood pressures.  Derrel Nip, MD  04/28/2020, 12:01 PM  Attestation of Supervision of Student:  I confirm that I have verified the information documented in the  resident's  note and that I have also personally reperformed the history, physical exam and all medical decision making activities.  I have verified that all services and findings are accurately documented in this student's note; and I agree with management and plan as outlined in the documentation. I have also made any necessary editorial changes.  Sheila Oats, MD Center for [facility], [facility] 04/28/2020 1:40 PM

## 2020-04-28 NOTE — Progress Notes (Signed)
Patient with elevated BP at home 160/100 and in office 154/100. Meets criteria for gHTN.  Will direct admit for IOL

## 2020-04-28 NOTE — Discharge Summary (Signed)
Postpartum Discharge Summary     Patient Name: Brenda Mcneil DOB: 1990-07-27 MRN: [id number]  Date of admission: 04/28/2020 Delivery date:04/28/2020  Delivering provider: Gifford Shave  Date of discharge: 04/30/2020  Admitting diagnosis: Gestational hypertension [O13.9] Intrauterine pregnancy: [redacted]w[redacted]d    Secondary diagnosis:  Active Problems:   Biological false positive RPR test   Gestational hypertension   Vaginal delivery   Postpartum anemia  Additional problems: as noted above   Discharge diagnosis: Term Pregnancy Delivered                                              Post partum procedures:none Augmentation: AROM and Pitocin Complications: None  Hospital course: Induction of Labor With Vaginal Delivery   30y.o. yo G2P1001 at 349w0das admitted to the hospital 04/28/2020 for induction of labor.  Indication for induction: Gestational hypertension.  Patient had an uncomplicated labor course as follows: Membrane Rupture Time/Date: 7:35 PM ,04/28/2020   Delivery Method:Vaginal, Spontaneous  Episiotomy: None  Lacerations:  Periurethral  Details of delivery can be found in separate delivery note.  Patient had a routine postpartum course. Her blood pressures remained normotensive. Patient is discharged home 04/30/20.  Newborn Data: Birth date:04/28/2020  Birth time:7:47 PM  Gender:Female  Living status:Living  Apgars:9 ,9  Weight:3771 g   Magnesium Sulfate received: No BMZ received: No Rhophylac:N/A MMR:N/A T-DaP:declined Flu:  declined Transfusion:No  Physical exam  Vitals:   04/29/20 0745 04/29/20 1143 04/29/20 2247 04/30/20 0530  BP: 124/84 129/77 128/81 126/81  Pulse: 70 75  70  Resp:  '18 18 18  ' Temp: 98 F (36.7 C) (!) 97.5 F (36.4 C) 98.1 F (36.7 C) 97.9 F (36.6 C)  TempSrc: Oral Oral Oral Oral  SpO2: 100% 100%    Weight:      Height:       General: alert, cooperative and no distress Lochia: appropriate Uterine Fundus: firm Incision: N/A DVT  Evaluation: No evidence of DVT seen on physical exam. Labs: Lab Results  Component Value Date   WBC 10.3 04/29/2020   HGB 8.8 (L) 04/29/2020   HCT 26.9 (L) 04/29/2020   MCV 81.8 04/29/2020   PLT 130 (L) 04/29/2020   CMP Latest Ref Rng & Units 04/29/2020  Glucose 70 - 99 mg/dL 68(L)  BUN 6 - 20 mg/dL 7  Creatinine 0.44 - 1.00 mg/dL 0.64  Sodium 135 - 145 mmol/L 137  Potassium 3.5 - 5.1 mmol/L 3.5  Chloride 98 - 111 mmol/L 109  CO2 22 - 32 mmol/L 23  Calcium 8.9 - 10.3 mg/dL 8.5(L)  Total Protein 6.5 - 8.1 g/dL 4.8(L)  Total Bilirubin 0.3 - 1.2 mg/dL 0.9  Alkaline Phos 38 - 126 U/L 107  AST 15 - 41 U/L 26  ALT 0 - 44 U/L 15   Edinburgh Score: Edinburgh Postnatal Depression Scale Screening Tool 04/28/2020  I have been able to laugh and see the funny side of things. 0  I have looked forward with enjoyment to things. 0  I have blamed myself unnecessarily when things went wrong. 1  I have been anxious or worried for no good reason. 1  I have felt scared or panicky for no good reason. 0  Things have been getting on top of me. 0  I have been so unhappy that I have had difficulty sleeping. 0  I have felt  sad or miserable. 0  I have been so unhappy that I have been crying. 0  The thought of harming myself has occurred to me. 0  Edinburgh Postnatal Depression Scale Total 2     After visit meds:  Allergies as of 04/30/2020      Reactions   Vicodin [hydrocodone-acetaminophen] Nausea And Vomiting      Medication List    TAKE these medications   acetaminophen 325 MG tablet Commonly known as: Tylenol Take 2 tablets (650 mg total) by mouth every 4 (four) hours as needed (for pain scale < 4).   Blood Pressure Cuff Misc 1 Device by Does not apply route once a week.   coconut oil Oil Apply 1 application topically as needed.   ferrous sulfate 325 (65 FE) MG tablet Take 1 tablet (325 mg total) by mouth every other day.   ibuprofen 600 MG tablet Commonly known as: ADVIL Take 1  tablet (600 mg total) by mouth every 6 (six) hours.   prenatal multivitamin Tabs tablet Take 1 tablet by mouth daily at 12 noon.        Discharge home in stable condition Infant Feeding: Breast Infant Disposition:home with mother Discharge instruction: per After Visit Summary and Postpartum booklet. Activity: Advance as tolerated. Pelvic rest for 6 weeks.  Diet: routine diet Future Appointments: Future Appointments  Date Time Provider [place]  05/05/2020 10:00 AM [place] None  05/26/2020 11:00 AM Chancy Milroy, MD [place] None   Follow up Visit: Message sent to [facility] by Dr. Astrid Drafts.  Please schedule this patient for a In person postpartum visit in 6 weeks with the following provider: Any provider. Additional Postpartum F/U:BP check 1 week  Low risk pregnancy complicated by: gHTN, thrombocytopenia Delivery mode:  Vaginal, Spontaneous  Anticipated Birth Control:  Unsure   04/30/2020 Janet [place], MD

## 2020-04-28 NOTE — Progress Notes (Signed)
+   Fetal movement. Pt states she noticed some dark red blood this morning. States she has also had some increased blood pressure readings this read. Blood pressure is increased today in the office. No HA, visual changes or floaters.

## 2020-04-28 NOTE — Anesthesia Procedure Notes (Signed)
Epidural Patient location during procedure: OB Start time: 04/28/2020 3:54 PM End time: 04/28/2020 4:06 PM  Staffing Anesthesiologist: Lannie Fields, DO Performed: anesthesiologist   Preanesthetic Checklist Completed: patient identified, IV checked, risks and benefits discussed, monitors and equipment checked, pre-op evaluation and timeout performed  Epidural Patient position: sitting Prep: DuraPrep and site prepped and draped Patient monitoring: continuous pulse ox, blood pressure, heart rate and cardiac monitor Approach: midline Location: L3-L4 Injection technique: LOR air  Needle:  Needle type: Tuohy  Needle gauge: 17 G Needle length: 9 cm Catheter type: closed end flexible Catheter size: 19 Gauge Test dose: negative  Assessment Sensory level: T8 Events: blood not aspirated, injection not painful, no injection resistance, no paresthesia and negative IV test  Additional Notes Patient identified. Risks/Benefits/Options discussed with patient including but not limited to bleeding, infection, nerve damage, paralysis, failed block, incomplete pain control, headache, blood pressure changes, nausea, vomiting, reactions to medication both or allergic, itching and postpartum back pain. Confirmed with bedside nurse the patient's most recent platelet count. Confirmed with patient that they are not currently taking any anticoagulation, have any bleeding history or any family history of bleeding disorders. Patient expressed understanding and wished to proceed. All questions were answered. Sterile technique was used throughout the entire procedure. Please see nursing notes for vital signs. Test dose was given through epidural catheter and negative prior to continuing to dose epidural or start infusion. Warning signs of high block given to the patient including shortness of breath, tingling/numbness in hands, complete motor block, or any concerning symptoms with instructions to call for  help. Patient was given instructions on fall risk and not to get out of bed. All questions and concerns addressed with instructions to call with any issues or inadequate analgesia.  Reason for block:procedure for pain

## 2020-04-28 NOTE — Discharge Instructions (Signed)

## 2020-04-28 NOTE — Patient Instructions (Signed)
Labor Induction Labor induction is when steps are taken to cause a pregnant woman to begin the labor process. Most women go into labor on their own between 37 weeks and 42 weeks of pregnancy. When this does not happen, or when there is a medical need for labor to begin, steps may be taken to induce, or bring on, labor. Labor induction causes a pregnant woman's uterus to contract. It also causes the cervix to soften (ripen), open (dilate), and thin out. Usually, labor is not induced before 39 weeks of pregnancy unless there is a medical reason to do so. When is labor induction considered? Labor induction may be right for you if:  Your pregnancy lasts longer than 41 to 42 weeks.  Your placenta is separating from your uterus (placental abruption).  You have a rupture of membranes and your labor does not begin.  You have health problems, like diabetes or high blood pressure (preeclampsia) during your pregnancy.  Your baby has stopped growing or does not have enough amniotic fluid. Before labor induction begins, your health care provider will consider the following factors:  Your medical condition and the baby's condition.  How many weeks you have been pregnant.  How mature the baby's lungs are.  The condition of your cervix.  The position of the baby.  The size of your birth canal. Tell a health care provider about:  Any allergies you have.  All medicines you are taking, including vitamins, herbs, eye drops, creams, and over-the-counter medicines.  Any problems you or your family members have had with anesthetic medicines.  Any surgeries you have had.  Any blood disorders you have.  Any medical conditions you have. What are the risks? Generally, this is a safe procedure. However, problems may occur, including:  Failed induction.  Changes in fetal heart rate, such as being too high, too low, or irregular (erratic).  Infection in the mother or the baby.  Increased risk of  having a cesarean delivery.  Breaking off (abruption) of the placenta from the uterus. This is rare.  Rupture of the uterus. This is very rare.  Your baby could fail to get enough blood flow or oxygen. This can be life-threatening. When induction is needed for medical reasons, the benefits generally outweigh the risks. What happens during the procedure? During the procedure, your health care provider will use one of these methods to induce labor:  Stripping the membranes. In this method, the amniotic sac tissue is gently separated from the cervix. This causes the following to happen: ? Your cervix stretches, which in turn causes the release of prostaglandins. ? Prostaglandins induce labor and cause the uterus to contract. ? This procedure is often done in an office visit. You will be sent home to wait for contractions to begin.  Prostaglandin medicine. This medicine starts contractions and causes the cervix to dilate and ripen. This can be taken by mouth (orally) or by being inserted into the vagina (suppository).  Inserting a small, thin tube (catheter) with a balloon into the vagina and then expanding the balloon with water to dilate the cervix.  Breaking the water. In this method, a small instrument is used to make a small hole in the amniotic sac. This eventually causes the amniotic sac to break. Contractions should begin within a few hours.  Medicine to trigger or strengthen contractions. This medicine is given through an IV that is inserted into a vein in your arm. This procedure may vary among health care providers and hospitals.     Where to find more information  March of Dimes: www.marchofdimes.org  The American College of Obstetricians and Gynecologists: www.acog.org Summary  Labor induction causes a pregnant woman's uterus to contract. It also causes the cervix to soften (ripen), open (dilate), and thin out.  Labor is usually not induced before 39 weeks of pregnancy unless  there is a medical reason to do so.  When induction is needed for medical reasons, the benefits generally outweigh the risks.  Talk with your health care provider about which methods of labor induction are right for you. This information is not intended to replace advice given to you by your health care provider. Make sure you discuss any questions you have with your health care provider. Document Revised: 10/30/2019 Document Reviewed: 10/30/2019 Elsevier Patient Education  2021 Elsevier Inc.  

## 2020-04-29 DIAGNOSIS — O9081 Anemia of the puerperium: Secondary | ICD-10-CM | POA: Diagnosis not present

## 2020-04-29 DIAGNOSIS — D649 Anemia, unspecified: Secondary | ICD-10-CM

## 2020-04-29 DIAGNOSIS — O9903 Anemia complicating the puerperium: Secondary | ICD-10-CM

## 2020-04-29 LAB — CBC
HCT: 26.9 % — ABNORMAL LOW (ref 36.0–46.0)
Hemoglobin: 8.8 g/dL — ABNORMAL LOW (ref 12.0–15.0)
MCH: 26.7 pg (ref 26.0–34.0)
MCHC: 32.7 g/dL (ref 30.0–36.0)
MCV: 81.8 fL (ref 80.0–100.0)
Platelets: 130 10*3/uL — ABNORMAL LOW (ref 150–400)
RBC: 3.29 MIL/uL — ABNORMAL LOW (ref 3.87–5.11)
RDW: 14 % (ref 11.5–15.5)
WBC: 10.3 10*3/uL (ref 4.0–10.5)
nRBC: 0 % (ref 0.0–0.2)

## 2020-04-29 LAB — RPR: RPR Ser Ql: NONREACTIVE

## 2020-04-29 LAB — COMPREHENSIVE METABOLIC PANEL
ALT: 15 U/L (ref 0–44)
AST: 26 U/L (ref 15–41)
Albumin: 2.2 g/dL — ABNORMAL LOW (ref 3.5–5.0)
Alkaline Phosphatase: 107 U/L (ref 38–126)
Anion gap: 5 (ref 5–15)
BUN: 7 mg/dL (ref 6–20)
CO2: 23 mmol/L (ref 22–32)
Calcium: 8.5 mg/dL — ABNORMAL LOW (ref 8.9–10.3)
Chloride: 109 mmol/L (ref 98–111)
Creatinine, Ser: 0.64 mg/dL (ref 0.44–1.00)
GFR, Estimated: >60 mL/min (ref >60)
Glucose, Bld: 68 mg/dL — ABNORMAL LOW (ref 70–99)
Potassium: 3.5 mmol/L (ref 3.5–5.1)
Sodium: 137 mmol/L (ref 135–145)
Total Bilirubin: 0.9 mg/dL (ref 0.3–1.2)
Total Protein: 4.8 g/dL — ABNORMAL LOW (ref 6.5–8.1)

## 2020-04-29 MED ORDER — FERROUS FUMARATE 324 (106 FE) MG PO TABS
1.0000 | ORAL_TABLET | ORAL | Status: DC
  Administered 2020-04-29: 106 mg via ORAL
  Filled 2020-04-29: qty 1

## 2020-04-29 NOTE — Progress Notes (Signed)
Post Partum Day #1 Subjective: no complaints, up ad lib and tolerating PO; now bottlefeeding; plans for outpt circ; denies dizziness or s/s pre-e; wants to decide on contraception at [facility] visit  Objective: (after delivery had 145/85) Blood pressure 124/69, pulse 72, temperature 98.1 F (36.7 C), temperature source Oral, resp. rate 18, height 5\' 9"  (1.753 m), weight 88.9 kg, last menstrual period 07/30/2019, unknown if currently breastfeeding.  Physical Exam:  General: alert, cooperative and no distress Lochia: appropriate Uterine Fundus: firm DVT Evaluation: No evidence of DVT seen on physical exam.  Recent Labs    04/28/20 2044 04/29/20 0508  HGB 10.1* 8.8*  HCT 30.9* 26.9*    Assessment/Plan: Plan for discharge tomorrow Continue to monitor BPs today- may need antihypertensives Discussed abstinence until PP visit Start QOD Fe   LOS: 1 day   05/01/20 CNM 04/29/2020, 7:25 AM

## 2020-04-29 NOTE — Anesthesia Postprocedure Evaluation (Signed)
Anesthesia Post Note  Patient: Brenda Mcneil  Procedure(s) Performed: AN AD HOC LABOR EPIDURAL     Patient location during evaluation: Mother Baby Anesthesia Type: Epidural Level of consciousness: awake, awake and alert and oriented Pain management: pain level controlled Vital Signs Assessment: post-procedure vital signs reviewed and stable Respiratory status: spontaneous breathing and respiratory function stable Cardiovascular status: blood pressure returned to baseline Postop Assessment: no headache, epidural receding, patient able to bend at knees, adequate PO intake, no backache, no apparent nausea or vomiting and able to ambulate Anesthetic complications: no   No complications documented.  Last Vitals:  Vitals:   04/29/20 0311 04/29/20 0745  BP: 124/69 124/84  Pulse: 72 70  Resp: 18   Temp: 36.7 C 36.7 C  SpO2:  100%    Last Pain:  Vitals:   04/29/20 0745  TempSrc: Oral  PainSc: 3    Pain Goal:                   Cleda Clarks

## 2020-04-30 DIAGNOSIS — O135 Gestational [pregnancy-induced] hypertension without significant proteinuria, complicating the puerperium: Secondary | ICD-10-CM

## 2020-04-30 MED ORDER — COCONUT OIL OIL: 1.0000 "application " | TOPICAL_OIL | 0 refills | Status: DC | PRN

## 2020-04-30 MED ORDER — ACETAMINOPHEN 325 MG PO TABS: 650.0000 mg | ORAL_TABLET | ORAL | 0 refills | Status: DC | PRN

## 2020-04-30 MED ORDER — FERROUS SULFATE 325 (65 FE) MG PO TABS: 325.0000 mg | ORAL_TABLET | ORAL | 3 refills | Status: DC

## 2020-04-30 MED ORDER — IBUPROFEN 600 MG PO TABS: 600.0000 mg | ORAL_TABLET | Freq: Four times a day (QID) | ORAL | 0 refills | Status: DC

## 2020-05-05 ENCOUNTER — Ambulatory Visit: Payer: Self-pay

## 2020-05-05 ENCOUNTER — Encounter: Payer: Self-pay | Admitting: Obstetrics and Gynecology

## 2020-05-10 ENCOUNTER — Other Ambulatory Visit: Payer: Self-pay

## 2020-05-10 ENCOUNTER — Ambulatory Visit (INDEPENDENT_AMBULATORY_CARE_PROVIDER_SITE_OTHER): Payer: Self-pay

## 2020-05-10 VITALS — BP 126/83 | HR 80 | Wt 166.9 lb

## 2020-05-10 DIAGNOSIS — Z0130 Encounter for examination of blood pressure without abnormal findings: Secondary | ICD-10-CM

## 2020-05-10 NOTE — Progress Notes (Signed)
Subjective:  Brenda Mcneil is a 30 y.o. female here for BP check.  S/p NSVD on 04/28/20.   Hypertension ROS: no medication side effects noted, no TIA's, no chest pain on exertion, no dyspnea on exertion and no swelling of ankles.    Objective:  BP 126/83 P 80 Wt 166 lb  LMP 07/30/2019   Appearance alert, well appearing, and in no distress. General exam BP noted to be well controlled today in office.    Assessment:   Blood Pressure well controlled.   Plan:  Keep upcoming routine pp visit.

## 2020-05-11 NOTE — Progress Notes (Signed)
Patient was assessed and managed by nursing staff during this encounter. I have reviewed the chart and agree with the documentation and plan. I have also made any necessary editorial changes.  Hoa Briggs, MD 05/11/2020 3:40 PM    

## 2020-05-26 ENCOUNTER — Encounter: Payer: Self-pay | Admitting: Obstetrics and Gynecology

## 2020-05-26 ENCOUNTER — Ambulatory Visit (INDEPENDENT_AMBULATORY_CARE_PROVIDER_SITE_OTHER): Payer: Medicaid Other | Admitting: Obstetrics and Gynecology

## 2020-05-26 ENCOUNTER — Other Ambulatory Visit: Payer: Self-pay

## 2020-05-26 DIAGNOSIS — Z309 Encounter for contraceptive management, unspecified: Secondary | ICD-10-CM | POA: Insufficient documentation

## 2020-05-26 DIAGNOSIS — Z30016 Encounter for initial prescription of transdermal patch hormonal contraceptive device: Secondary | ICD-10-CM | POA: Diagnosis not present

## 2020-05-26 MED ORDER — NORELGESTROMIN-ETH ESTRADIOL 150-35 MCG/24HR TD PTWK: 1.0000 | MEDICATED_PATCH | TRANSDERMAL | 12 refills | Status: DC

## 2020-05-26 NOTE — Progress Notes (Signed)
    Post Partum Visit Note  Brenda Mcneil is a 30 y.o. G66P2002 female who presents for a postpartum visit. She is 4 weeks postpartum following a normal spontaneous vaginal delivery.  I have fully reviewed the prenatal and intrapartum course. The delivery was at 39 gestational weeks.  Anesthesia: epidural. Postpartum course has been unremarkable. Baby is doing well. Baby is feeding by bottle Rush Barer . Bleeding staining only. Bowel function is normal. Bladder function is normal. Patient is not sexually active. Contraception method is none. Considering Patch  Postpartum depression screening: negative. EPDS= 0    The pregnancy intention screening data noted above was reviewed. Potential methods of contraception were discussed. The patient elected to proceed with Contraceptive Patch.     There are no preventive care reminders to display for this patient.    Review of Systems Pertinent items noted in HPI and remainder of comprehensive ROS otherwise negative.  Objective:  LMP 07/30/2019    General:  alert   Breasts:  deferred  Lungs: clear to auscultation bilaterally  Heart:  regular rate and rhythm, S1, S2 normal, no murmur, click, rub or gallop  Abdomen: soft, non-tender; bowel sounds normal; no masses,  no organomegaly   Wound NA  GU exam:  not indicated       Assessment:    There are no diagnoses linked to this encounter.  Nl postpartum exam.   Plan:   Essential components of care per ACOG recommendations:  1.  Mood and well being: Patient with negative depression screening today. Reviewed local resources for support.  - Patient does not use tobacco.  - hx of drug use? No  2. Infant care and feeding:  -Patient currently breastmilk feeding? No  -Social determinants of health (SDOH) reviewed in EPIC. No concerns* 3. Sexuality, contraception and birth spacing - Patient does not know want a pregnancy in the next year.  Desired family size is uncertain children.  - Reviewed  forms of contraception in tiered fashion. Patient desired Lucienne Minks today.   - Discussed birth spacing of 18 months  4. Sleep and fatigue -Encouraged family/partner/community support of 4 hrs of uninterrupted sleep to help with mood and fatigue  5. Physical Recovery  - Discussed patients delivery and complications - Patient had a Vaginal, no problems at delivery. Perineal healing reviewed. Patient expressed understanding - Patient has urinary incontinence? No  - Patient is safe to resume physical and sexual activity  6.  Health Maintenance - HM due items addressed Yes - Last pap smear N/A Pap smear not done at today's visit.  Pt declined wants to schedule at another time. Provider made aware    7. Chronic Disease NA - PCP follow up   Nettie Elm, MD Center for [facility], [facility]

## 2020-05-26 NOTE — Patient Instructions (Signed)
Health Maintenance, Female Adopting a healthy lifestyle and getting preventive care are important in promoting health and wellness. Ask your health care provider about:  The right schedule for you to have regular tests and exams.  Things you can do on your own to prevent diseases and keep yourself healthy. What should I know about diet, weight, and exercise? Eat a healthy diet  Eat a diet that includes plenty of vegetables, fruits, low-fat dairy products, and lean protein.  Do not eat a lot of foods that are high in solid fats, added sugars, or sodium.   Maintain a healthy weight Body mass index (BMI) is used to identify weight problems. It estimates body fat based on height and weight. Your health care provider can help determine your BMI and help you achieve or maintain a healthy weight. Get regular exercise Get regular exercise. This is one of the most important things you can do for your health. Most adults should:  Exercise for at least 150 minutes each week. The exercise should increase your heart rate and make you sweat (moderate-intensity exercise).  Do strengthening exercises at least twice a week. This is in addition to the moderate-intensity exercise.  Spend less time sitting. Even light physical activity can be beneficial. Watch cholesterol and blood lipids Have your blood tested for lipids and cholesterol at 31 years of age, then have this test every 5 years. Have your cholesterol levels checked more often if:  Your lipid or cholesterol levels are high.  You are older than 31 years of age.  You are at high risk for heart disease. What should I know about cancer screening? Depending on your health history and family history, you may need to have cancer screening at various ages. This may include screening for:  Breast cancer.  Cervical cancer.  Colorectal cancer.  Skin cancer.  Lung cancer. What should I know about heart disease, diabetes, and high blood  pressure? Blood pressure and heart disease  High blood pressure causes heart disease and increases the risk of stroke. This is more likely to develop in people who have high blood pressure readings, are of African descent, or are overweight.  Have your blood pressure checked: ? Every 3-5 years if you are 18-39 years of age. ? Every year if you are 40 years old or older. Diabetes Have regular diabetes screenings. This checks your fasting blood sugar level. Have the screening done:  Once every three years after age 40 if you are at a normal weight and have a low risk for diabetes.  More often and at a younger age if you are overweight or have a high risk for diabetes. What should I know about preventing infection? Hepatitis B If you have a higher risk for hepatitis B, you should be screened for this virus. Talk with your health care provider to find out if you are at risk for hepatitis B infection. Hepatitis C Testing is recommended for:  Everyone born from 1945 through 1965.  Anyone with known risk factors for hepatitis C. Sexually transmitted infections (STIs)  Get screened for STIs, including gonorrhea and chlamydia, if: ? You are sexually active and are younger than 31 years of age. ? You are older than 31 years of age and your health care provider tells you that you are at risk for this type of infection. ? Your sexual activity has changed since you were last screened, and you are at increased risk for chlamydia or gonorrhea. Ask your health care provider   if you are at risk.  Ask your health care provider about whether you are at high risk for HIV. Your health care provider may recommend a prescription medicine to help prevent HIV infection. If you choose to take medicine to prevent HIV, you should first get tested for HIV. You should then be tested every 3 months for as Kassab as you are taking the medicine. Pregnancy  If you are about to stop having your period (premenopausal) and  you may become pregnant, seek counseling before you get pregnant.  Take 400 to 800 micrograms (mcg) of folic acid every day if you become pregnant.  Ask for birth control (contraception) if you want to prevent pregnancy. Osteoporosis and menopause Osteoporosis is a disease in which the bones lose minerals and strength with aging. This can result in bone fractures. If you are 65 years old or older, or if you are at risk for osteoporosis and fractures, ask your health care provider if you should:  Be screened for bone loss.  Take a calcium or vitamin D supplement to lower your risk of fractures.  Be given hormone replacement therapy (HRT) to treat symptoms of menopause. Follow these instructions at home: Lifestyle  Do not use any products that contain nicotine or tobacco, such as cigarettes, e-cigarettes, and chewing tobacco. If you need help quitting, ask your health care provider.  Do not use street drugs.  Do not share needles.  Ask your health care provider for help if you need support or information about quitting drugs. Alcohol use  Do not drink alcohol if: ? Your health care provider tells you not to drink. ? You are pregnant, may be pregnant, or are planning to become pregnant.  If you drink alcohol: ? Limit how much you use to 0-1 drink a day. ? Limit intake if you are breastfeeding.  Be aware of how much alcohol is in your drink. In the [place] one drink equals one 12 oz bottle of beer (355 mL), one 5 oz glass of wine (148 mL), or one 1 oz glass of hard liquor (44 mL). General instructions  Schedule regular health, dental, and eye exams.  Stay current with your vaccines.  Tell your health care provider if: ? You often feel depressed. ? You have ever been abused or do not feel safe at home. Summary  Adopting a healthy lifestyle and getting preventive care are important in promoting health and wellness.  Follow your health care provider's instructions about healthy  diet, exercising, and getting tested or screened for diseases.  Follow your health care provider's instructions on monitoring your cholesterol and blood pressure. This information is not intended to replace advice given to you by your health care provider. Make sure you discuss any questions you have with your health care provider. Document Revised: 01/09/2018 Document Reviewed: 01/09/2018 Elsevier Patient Education  2021 [employer]  

## 2022-01-10 ENCOUNTER — Other Ambulatory Visit: Payer: Self-pay | Admitting: Obstetrics and Gynecology

## 2022-01-10 DIAGNOSIS — Z30016 Encounter for initial prescription of transdermal patch hormonal contraceptive device: Secondary | ICD-10-CM

## 2022-07-14 IMAGING — DX DG CHEST 1V PORT
1 series · 1 of 1 positions shown · non-contrast
Comparison: None.

CLINICAL DATA: Shortness of breath, COVID positive

EXAM:
PORTABLE CHEST 1 VIEW

[chest ap]
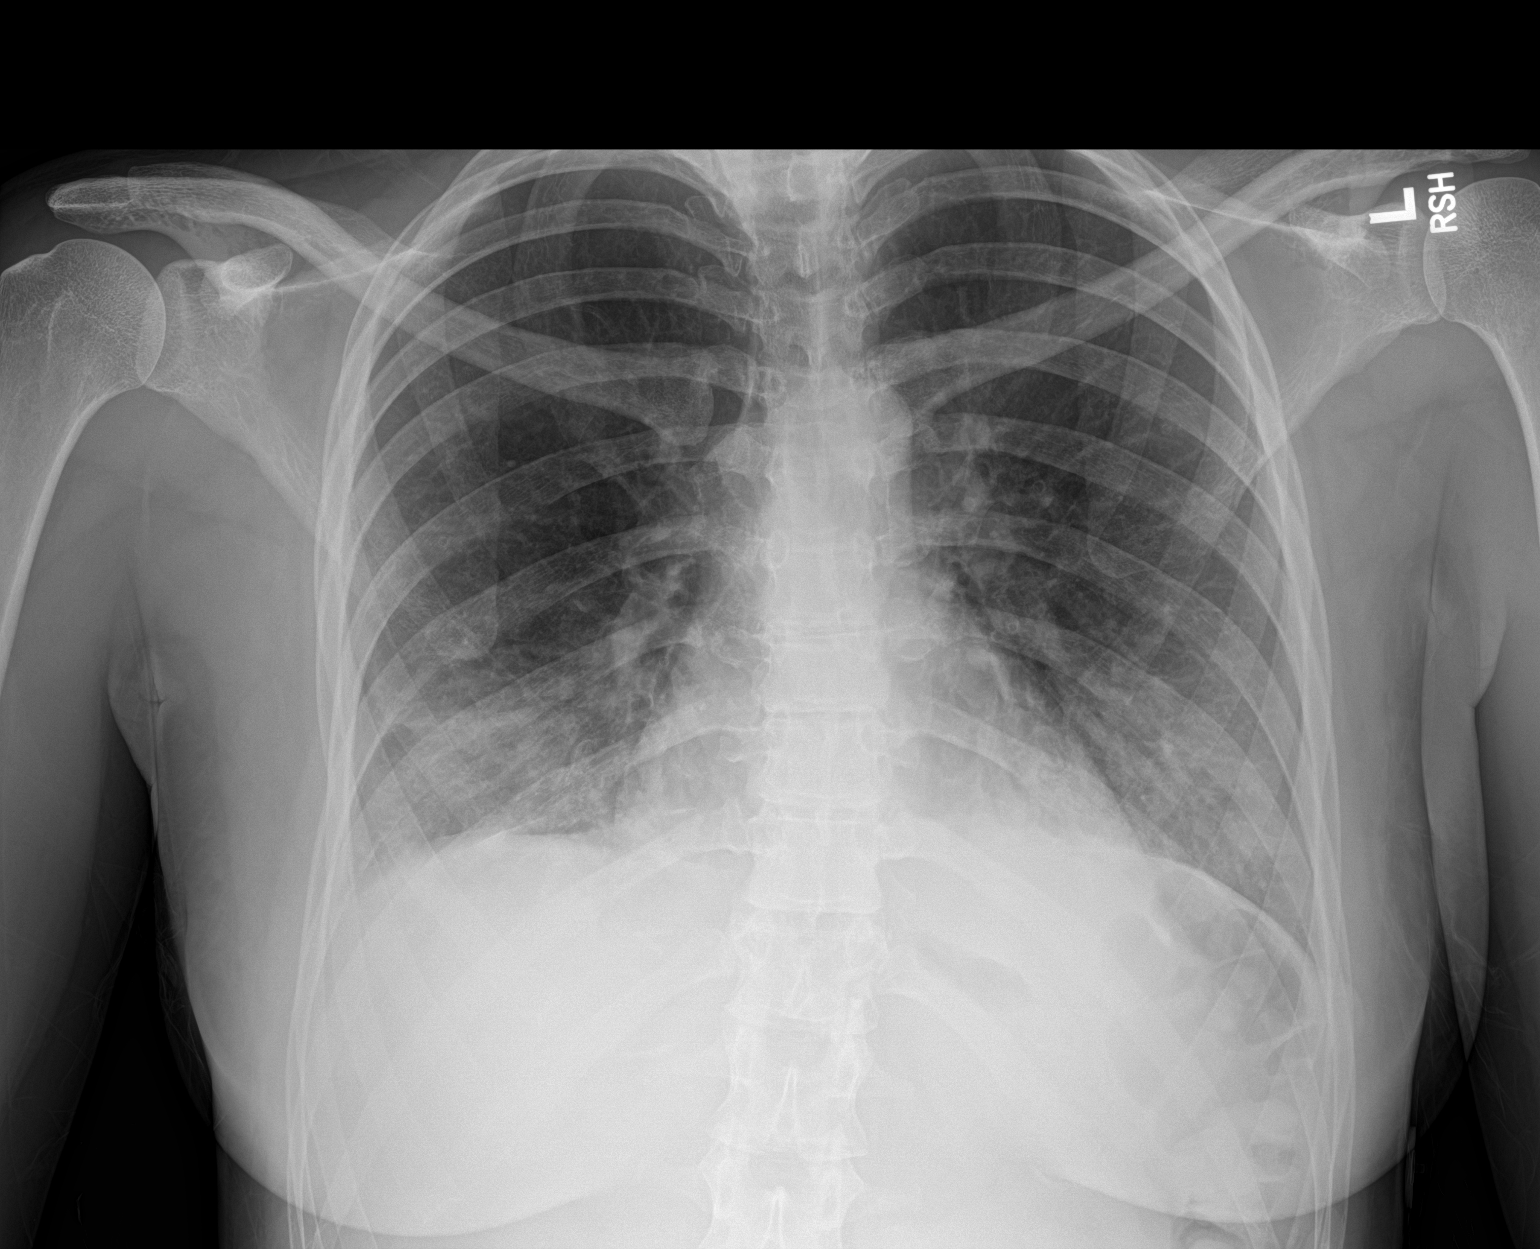

[1 of 1 positions shown; findings below may reference images not displayed]

FINDINGS: Patchy bilateral opacities in the lower lungs. No significant
pleural effusion. No pneumothorax. Cardiomediastinal contours are
within normal limits.
IMPRESSION: Patchy bilateral pulmonary opacities [DATE] pneumonia.

## 2022-08-04 ENCOUNTER — Other Ambulatory Visit: Payer: Self-pay | Admitting: Obstetrics

## 2022-08-04 DIAGNOSIS — Z30016 Encounter for initial prescription of transdermal patch hormonal contraceptive device: Secondary | ICD-10-CM

## 2022-08-14 ENCOUNTER — Other Ambulatory Visit: Payer: Self-pay

## 2022-08-14 DIAGNOSIS — Z30016 Encounter for initial prescription of transdermal patch hormonal contraceptive device: Secondary | ICD-10-CM

## 2022-08-14 MED ORDER — NORELGESTROMIN-ETH ESTRADIOL 150-35 MCG/24HR TD PTWK: 1.0000 | MEDICATED_PATCH | TRANSDERMAL | 3 refills | Status: AC

## 2023-03-06 NOTE — Progress Notes (Signed)
 Surgery orders requested via Epic inbox.

## 2023-03-08 ENCOUNTER — Ambulatory Visit: Payer: Self-pay | Admitting: Emergency Medicine

## 2023-03-08 DIAGNOSIS — G8929 Other chronic pain: Secondary | ICD-10-CM

## 2023-03-08 NOTE — H&P (View-Only) (Signed)
 TOTAL KNEE ADMISSION H&P  Patient is being admitted for left total knee arthroplasty.  Subjective:  Chief Complaint:left knee pain.  HPI: Brenda Mcneil, 33 y.o. female, has a history of pain and functional disability in the left knee due to  posttraumatic arthritis  and has failed non-surgical conservative treatments for greater than 12 weeks to includeNSAID's and/or analgesics, corticosteriod injections, viscosupplementation injections, supervised PT with diminished ADL's post treatment, use of assistive devices, and activity modification.  Onset of symptoms was gradual, starting >10 years ago with gradually worsening course since that time. The patient noted prior procedures on the knee to include  ACL reconstruction x2 on the left knee(s).  Patient currently rates pain in the left knee(s) at 8 out of 10 with activity. Patient has night pain, worsening of pain with activity and weight bearing, pain that interferes with activities of daily living, and pain with passive range of motion.  Patient has evidence of periarticular osteophytes and joint space narrowing by imaging studies.  There is no active infection.  Patient Active Problem List   Diagnosis Date Noted   Postpartum care following vaginal delivery 05/26/2020   Contraception management 05/26/2020   Past Medical History:  Diagnosis Date   Biological false positive RPR test 03/02/2020   RPR reactive on 28 week labs on 02/24/20. With  Negative Tpal and titer 1:1, this is most likely false positive.  Repeat RPR in third trimester or when admitted to L&D. RPR NR at 37weeks.   Medical history non-contributory     Past Surgical History:  Procedure Laterality Date   ARTHROSCOPIC REPAIR ACL Left    KNEE ARTHROSCOPY W/ ACL RECONSTRUCTION      Current Outpatient Medications  Medication Sig Dispense Refill Last Dose/Taking   ferrous sulfate  325 (65 FE) MG tablet Take 1 tablet (325 mg total) by mouth every other day. 30 tablet 3     norelgestromin -ethinyl estradiol  (XULANE) 150-35 MCG/24HR transdermal patch Place 1 patch onto the skin once a week. 3 patch 3    Prenatal Vit-Fe Fumarate-FA (PRENATAL MULTIVITAMIN) TABS tablet Take 1 tablet by mouth daily at 12 noon.      No current facility-administered medications for this visit.   Allergies  Allergen Reactions   Vicodin [Hydrocodone-Acetaminophen ] Nausea And Vomiting    Social History   Tobacco Use   Smoking status: Never   Smokeless tobacco: Never  Substance Use Topics   Alcohol  use: No    No family history on file.   Review of Systems  Musculoskeletal:  Positive for arthralgias.  All other systems reviewed and are negative.   Objective:  Physical Exam Constitutional:      General: She is not in acute distress.    Appearance: Normal appearance. She is not ill-appearing.  HENT:     Head: Normocephalic and atraumatic.     Right Ear: External ear normal.     Left Ear: External ear normal.     Nose: Nose normal.     Mouth/Throat:     Mouth: Mucous membranes are moist.     Pharynx: Oropharynx is clear.  Eyes:     Extraocular Movements: Extraocular movements intact.     Conjunctiva/sclera: Conjunctivae normal.  Cardiovascular:     Rate and Rhythm: Normal rate and regular rhythm.     Pulses: Normal pulses.     Heart sounds: Normal heart sounds.  Pulmonary:     Effort: Pulmonary effort is normal.     Breath sounds: Normal breath sounds.  Abdominal:  General: Bowel sounds are normal.     Palpations: Abdomen is soft.     Tenderness: There is no abdominal tenderness.  Musculoskeletal:        General: Tenderness present.     Cervical back: Normal range of motion and neck supple.     Comments: TTP over medial and lateral joint line, medial worse than lateral.  No calf tenderness, swelling, or erythema.  No overlying lesions of area of chief complaint.  Decreased strength and ROM due to elicited pain.  Pre-operative ROM 0-115.  Dorsiflexion and  plantarflexion intact.  Stable to varus and valgus stress.  BLE appear grossly neurovascularly intact.  Gait mildly antalgic.   Skin:    General: Skin is warm and dry.  Neurological:     Mental Status: She is alert and oriented to person, place, and time. Mental status is at baseline.  Psychiatric:        Mood and Affect: Mood normal.        Behavior: Behavior normal.     Vital signs in last 24 hours: @VSRANGES @  Labs:   Estimated body mass index is 25.1 kg/m as calculated from the following:   Height as of 04/28/20: 5' 9 (1.753 m).   Weight as of 05/26/20: 77.1 kg.   Imaging Review Plain radiographs demonstrate severe degenerative joint disease of the left knee(s). The overall alignment issignificant varus. The bone quality appears to be good for age and reported activity level.      Assessment/Plan:  End stage arthritis, left knee   The patient history, physical examination, clinical judgment of the provider and imaging studies are consistent with end stage degenerative joint disease of the left knee(s) and total knee arthroplasty is deemed medically necessary. The treatment options including medical management, injection therapy arthroscopy and arthroplasty were discussed at length. The risks and benefits of total knee arthroplasty were presented and reviewed. The risks due to aseptic loosening, infection, stiffness, patella tracking problems, thromboembolic complications and other imponderables were discussed. The patient acknowledged the explanation, agreed to proceed with the plan and consent was signed. Patient is being admitted for inpatient treatment for surgery, pain control, PT, OT, prophylactic antibiotics, VTE prophylaxis, progressive ambulation and ADL's and discharge planning. The patient is planning to be discharged home with outpatient PT.     Patient's anticipated LOS is less than 2 midnights, meeting these requirements: - Younger than 63 - Lives within 1  hour of care - Has a competent adult at home to recover with post-op recover - NO history of  - Chronic pain requiring opiods  - Diabetes  - Coronary Artery Disease  - Heart failure  - Heart attack  - Stroke  - DVT/VTE  - Cardiac arrhythmia  - Respiratory Failure/COPD  - Renal failure  - Advanced Liver disease

## 2023-03-08 NOTE — H&P (Signed)
 TOTAL KNEE ADMISSION H&P  Patient is being admitted for left total knee arthroplasty.  Subjective:  Chief Complaint:left knee pain.  HPI: Brenda Mcneil, 33 y.o. female, has a history of pain and functional disability in the left knee due to  posttraumatic arthritis  and has failed non-surgical conservative treatments for greater than 12 weeks to includeNSAID's and/or analgesics, corticosteriod injections, viscosupplementation injections, supervised PT with diminished ADL's post treatment, use of assistive devices, and activity modification.  Onset of symptoms was gradual, starting >10 years ago with gradually worsening course since that time. The patient noted prior procedures on the knee to include  ACL reconstruction x2 on the left knee(s).  Patient currently rates pain in the left knee(s) at 8 out of 10 with activity. Patient has night pain, worsening of pain with activity and weight bearing, pain that interferes with activities of daily living, and pain with passive range of motion.  Patient has evidence of periarticular osteophytes and joint space narrowing by imaging studies.  There is no active infection.  Patient Active Problem List   Diagnosis Date Noted   Postpartum care following vaginal delivery 05/26/2020   Contraception management 05/26/2020   Past Medical History:  Diagnosis Date   Biological false positive RPR test 03/02/2020   RPR reactive on 28 week labs on 02/24/20. With  Negative Tpal and titer 1:1, this is most likely false positive.  Repeat RPR in third trimester or when admitted to L&D. RPR NR at 37weeks.   Medical history non-contributory     Past Surgical History:  Procedure Laterality Date   ARTHROSCOPIC REPAIR ACL Left    KNEE ARTHROSCOPY W/ ACL RECONSTRUCTION      Current Outpatient Medications  Medication Sig Dispense Refill Last Dose/Taking   ferrous sulfate  325 (65 FE) MG tablet Take 1 tablet (325 mg total) by mouth every other day. 30 tablet 3     norelgestromin -ethinyl estradiol  (XULANE) 150-35 MCG/24HR transdermal patch Place 1 patch onto the skin once a week. 3 patch 3    Prenatal Vit-Fe Fumarate-FA (PRENATAL MULTIVITAMIN) TABS tablet Take 1 tablet by mouth daily at 12 noon.      No current facility-administered medications for this visit.   Allergies  Allergen Reactions   Vicodin [Hydrocodone-Acetaminophen ] Nausea And Vomiting    Social History   Tobacco Use   Smoking status: Never   Smokeless tobacco: Never  Substance Use Topics   Alcohol  use: No    No family history on file.   Review of Systems  Musculoskeletal:  Positive for arthralgias.  All other systems reviewed and are negative.   Objective:  Physical Exam Constitutional:      General: She is not in acute distress.    Appearance: Normal appearance. She is not ill-appearing.  HENT:     Head: Normocephalic and atraumatic.     Right Ear: External ear normal.     Left Ear: External ear normal.     Nose: Nose normal.     Mouth/Throat:     Mouth: Mucous membranes are moist.     Pharynx: Oropharynx is clear.  Eyes:     Extraocular Movements: Extraocular movements intact.     Conjunctiva/sclera: Conjunctivae normal.  Cardiovascular:     Rate and Rhythm: Normal rate and regular rhythm.     Pulses: Normal pulses.     Heart sounds: Normal heart sounds.  Pulmonary:     Effort: Pulmonary effort is normal.     Breath sounds: Normal breath sounds.  Abdominal:  General: Bowel sounds are normal.     Palpations: Abdomen is soft.     Tenderness: There is no abdominal tenderness.  Musculoskeletal:        General: Tenderness present.     Cervical back: Normal range of motion and neck supple.     Comments: TTP over medial and lateral joint line, medial worse than lateral.  No calf tenderness, swelling, or erythema.  No overlying lesions of area of chief complaint.  Decreased strength and ROM due to elicited pain.  Pre-operative ROM 0-115.  Dorsiflexion and  plantarflexion intact.  Stable to varus and valgus stress.  BLE appear grossly neurovascularly intact.  Gait mildly antalgic.   Skin:    General: Skin is warm and dry.  Neurological:     Mental Status: She is alert and oriented to person, place, and time. Mental status is at baseline.  Psychiatric:        Mood and Affect: Mood normal.        Behavior: Behavior normal.     Vital signs in last 24 hours: @VSRANGES @  Labs:   Estimated body mass index is 25.1 kg/m as calculated from the following:   Height as of 04/28/20: 5\' 9"  (1.753 m).   Weight as of 05/26/20: 77.1 kg.   Imaging Review Plain radiographs demonstrate severe degenerative joint disease of the left knee(s). The overall alignment issignificant varus. The bone quality appears to be good for age and reported activity level.      Assessment/Plan:  End stage arthritis, left knee   The patient history, physical examination, clinical judgment of the provider and imaging studies are consistent with end stage degenerative joint disease of the left knee(s) and total knee arthroplasty is deemed medically necessary. The treatment options including medical management, injection therapy arthroscopy and arthroplasty were discussed at length. The risks and benefits of total knee arthroplasty were presented and reviewed. The risks due to aseptic loosening, infection, stiffness, patella tracking problems, thromboembolic complications and other imponderables were discussed. The patient acknowledged the explanation, agreed to proceed with the plan and consent was signed. Patient is being admitted for inpatient treatment for surgery, pain control, PT, OT, prophylactic antibiotics, VTE prophylaxis, progressive ambulation and ADL's and discharge planning. The patient is planning to be discharged home with outpatient PT.     Patient's anticipated LOS is less than 2 midnights, meeting these requirements: - Younger than 79 - Lives within 1  hour of care - Has a competent adult at home to recover with post-op recover - NO history of  - Chronic pain requiring opiods  - Diabetes  - Coronary Artery Disease  - Heart failure  - Heart attack  - Stroke  - DVT/VTE  - Cardiac arrhythmia  - Respiratory Failure/COPD  - Renal failure  - Advanced Liver disease

## 2023-03-13 ENCOUNTER — Encounter (HOSPITAL_COMMUNITY)
Admission: RE | Admit: 2023-03-13 | Discharge: 2023-03-13 | Disposition: A | Discharge status: Home / Self Care | Payer: MEDICAID | Source: Ambulatory Visit | Attending: Orthopedic Surgery | Admitting: Orthopedic Surgery

## 2023-03-13 ENCOUNTER — Other Ambulatory Visit: Payer: Self-pay

## 2023-03-13 ENCOUNTER — Encounter (HOSPITAL_COMMUNITY): Payer: Self-pay | Admitting: Orthopedic Surgery

## 2023-03-13 VITALS — BP 130/77 | HR 71 | Temp 97.9°F | Resp 18 | Ht 68.0 in | Wt 156.5 lb

## 2023-03-13 DIAGNOSIS — G8929 Other chronic pain: Secondary | ICD-10-CM | POA: Insufficient documentation

## 2023-03-13 DIAGNOSIS — M25562 Pain in left knee: Secondary | ICD-10-CM | POA: Diagnosis not present

## 2023-03-13 DIAGNOSIS — Z01812 Encounter for preprocedural laboratory examination: Secondary | ICD-10-CM | POA: Diagnosis present

## 2023-03-13 DIAGNOSIS — Z01818 Encounter for other preprocedural examination: Secondary | ICD-10-CM

## 2023-03-13 LAB — TYPE AND SCREEN
ABO/RH(D): B POS
Antibody Screen: NEGATIVE

## 2023-03-13 LAB — SURGICAL PCR SCREEN
MRSA, PCR: NEGATIVE
Staphylococcus aureus: POSITIVE — AB

## 2023-03-13 NOTE — Progress Notes (Signed)
For Anesthesia: PCP - Aura Camps, FNP [facility]  Cardiologist - N/A  Bowel Prep reminder: N/A Chest x-ray - N/A EKG - N/A Stress Test - N/A ECHO - N/A Cardiac Cath - N/A Pacemaker/ICD device last checked:N/A Pacemaker orders received: N/A  Device Rep notified: N/A  Spinal Cord Stimulator:N/A  Sleep Study - N/A CPAP - N/A  Fasting Blood Sugar - N/A Checks Blood Sugar ___N/A__ times a day Date and result of last Hgb A1c-N/A  Blood Thinner Instructions: N/A Aspirin Instructions: N/A Last Dose: N/A  Activity level:  Able to exercise without chest pain and/or shortness of breath       Anesthesia review: N/A  Patient denies shortness of breath, fever, cough and chest pain at PAT appointment   Patient verbalized understanding of instructions that were given to them at the PAT appointment. Patient was also instructed that they will need to review over the PAT instructions again at home before surgery.

## 2023-03-24 NOTE — Anesthesia Preprocedure Evaluation (Signed)
 Anesthesia Evaluation  Patient identified by MRN, date of birth, ID band Patient awake    Reviewed: Allergy & Precautions, NPO status , Patient's Chart, lab work & pertinent test results  Airway Mallampati: III  TM Distance: >3 FB Neck ROM: Full    Dental  (+) Teeth Intact, Dental Advisory Given   Pulmonary neg pulmonary ROS   Pulmonary exam normal breath sounds clear to auscultation       Cardiovascular negative cardio ROS Normal cardiovascular exam Rhythm:Regular Rate:Normal     Neuro/Psych negative neurological ROS  negative psych ROS   GI/Hepatic negative GI ROS, Neg liver ROS,,,  Endo/Other  negative endocrine ROS    Renal/GU negative Renal ROS  negative genitourinary   Musculoskeletal  (+) Arthritis , Osteoarthritis,    Abdominal   Peds  Hematology negative hematology ROS (+)   Anesthesia Other Findings   Reproductive/Obstetrics negative OB ROS                             Anesthesia Physical Anesthesia Plan  ASA: 1  Anesthesia Plan: MAC, Regional and Spinal   Post-op Pain Management: Tylenol PO (pre-op)*, Toradol IV (intra-op)*, Regional block* and Dilaudid IV   Induction: Intravenous  PONV Risk Score and Plan: 2 and Ondansetron, Propofol infusion, TIVA, Midazolam and Treatment may vary due to age or medical condition  Airway Management Planned: Natural Airway and Simple Face Mask  Additional Equipment: None  Intra-op Plan:   Post-operative Plan:   Informed Consent: I have reviewed the patients History and Physical, chart, labs and discussed the procedure including the risks, benefits and alternatives for the proposed anesthesia with the patient or authorized representative who has indicated his/her understanding and acceptance.       Plan Discussed with: CRNA  Anesthesia Plan Comments:        Anesthesia Quick Evaluation

## 2023-03-26 ENCOUNTER — Encounter (HOSPITAL_COMMUNITY): Payer: Self-pay | Admitting: Orthopedic Surgery

## 2023-03-26 ENCOUNTER — Ambulatory Visit (HOSPITAL_COMMUNITY)
Admission: RE | Admit: 2023-03-26 | Discharge: 2023-03-26 | Disposition: A | Discharge status: Home / Self Care | Payer: Medicaid Other | Attending: Orthopedic Surgery | Admitting: Orthopedic Surgery

## 2023-03-26 ENCOUNTER — Ambulatory Visit (HOSPITAL_COMMUNITY): Payer: Self-pay | Admitting: Anesthesiology

## 2023-03-26 ENCOUNTER — Encounter (HOSPITAL_COMMUNITY)
Admission: RE | Disposition: A | Discharge status: Home / Self Care | Payer: Self-pay | Source: Home / Self Care | Attending: Orthopedic Surgery

## 2023-03-26 ENCOUNTER — Ambulatory Visit (HOSPITAL_COMMUNITY): Payer: Medicaid Other

## 2023-03-26 ENCOUNTER — Other Ambulatory Visit: Payer: Self-pay

## 2023-03-26 DIAGNOSIS — M1732 Unilateral post-traumatic osteoarthritis, left knee: Secondary | ICD-10-CM

## 2023-03-26 DIAGNOSIS — G8929 Other chronic pain: Secondary | ICD-10-CM

## 2023-03-26 DIAGNOSIS — Z7182 Exercise counseling: Secondary | ICD-10-CM | POA: Insufficient documentation

## 2023-03-26 DIAGNOSIS — Z01818 Encounter for other preprocedural examination: Secondary | ICD-10-CM

## 2023-03-26 HISTORY — DX: Anemia, unspecified: D64.9

## 2023-03-26 HISTORY — PX: TOTAL KNEE ARTHROPLASTY: SHX125

## 2023-03-26 HISTORY — DX: Unspecified osteoarthritis, unspecified site: M19.90

## 2023-03-26 LAB — COMPREHENSIVE METABOLIC PANEL
ALT: 13 U/L (ref 0–44)
AST: 21 U/L (ref 15–41)
Albumin: 3.3 g/dL — ABNORMAL LOW (ref 3.5–5.0)
Alkaline Phosphatase: 41 U/L (ref 38–126)
Anion gap: 9 (ref 5–15)
BUN: 8 mg/dL (ref 6–20)
CO2: 24 mmol/L (ref 22–32)
Calcium: 8.6 mg/dL — ABNORMAL LOW (ref 8.9–10.3)
Chloride: 106 mmol/L (ref 98–111)
Creatinine, Ser: 0.53 mg/dL (ref 0.44–1.00)
GFR, Estimated: >60 mL/min (ref >60)
Glucose, Bld: 143 mg/dL — ABNORMAL HIGH (ref 70–99)
Potassium: 3.9 mmol/L (ref 3.5–5.1)
Sodium: 139 mmol/L (ref 135–145)
Total Bilirubin: 0.5 mg/dL (ref 0.0–1.2)
Total Protein: 6.6 g/dL (ref 6.5–8.1)

## 2023-03-26 LAB — CBC WITH DIFFERENTIAL/PLATELET
Abs Immature Granulocytes: 0.02 10*3/uL (ref 0.00–0.07)
Basophils Absolute: 0 10*3/uL (ref 0.0–0.1)
Basophils Relative: 0 %
Eosinophils Absolute: 0 10*3/uL (ref 0.0–0.5)
Eosinophils Relative: 0 %
HCT: 34.1 % — ABNORMAL LOW (ref 36.0–46.0)
Hemoglobin: 10.9 g/dL — ABNORMAL LOW (ref 12.0–15.0)
Immature Granulocytes: 0 %
Lymphocytes Relative: 8 %
Lymphs Abs: 0.6 10*3/uL — ABNORMAL LOW (ref 0.7–4.0)
MCH: 28.2 pg (ref 26.0–34.0)
MCHC: 32 g/dL (ref 30.0–36.0)
MCV: 88.1 fL (ref 80.0–100.0)
Monocytes Absolute: 0.1 10*3/uL (ref 0.1–1.0)
Monocytes Relative: 1 %
Neutro Abs: 6.7 10*3/uL (ref 1.7–7.7)
Neutrophils Relative %: 91 %
Platelets: 176 10*3/uL (ref 150–400)
RBC: 3.87 MIL/uL (ref 3.87–5.11)
RDW: 12.6 % (ref 11.5–15.5)
WBC: 7.3 10*3/uL (ref 4.0–10.5)
nRBC: 0 % (ref 0.0–0.2)

## 2023-03-26 LAB — POCT PREGNANCY, URINE: Preg Test, Ur: NEGATIVE

## 2023-03-26 SURGERY — ARTHROPLASTY, KNEE, TOTAL
Anesthesia: Monitor Anesthesia Care | Site: Knee | Laterality: Left

## 2023-03-26 MED ORDER — OXYCODONE HCL 5 MG PO TABS: 2.5000 mg | ORAL_TABLET | Freq: Four times a day (QID) | ORAL | 0 refills | Status: AC | PRN

## 2023-03-26 MED ORDER — CHLORHEXIDINE GLUCONATE 0.12 % MT SOLN
15.0000 mL | Freq: Once | OROMUCOSAL | Status: AC
  Administered 2023-03-26: 15 mL via OROMUCOSAL

## 2023-03-26 MED ORDER — PROPOFOL 500 MG/50ML IV EMUL
INTRAVENOUS | Status: DC | PRN
  Administered 2023-03-26: 75 ug/kg/min via INTRAVENOUS

## 2023-03-26 MED ORDER — PROMETHAZINE-DM 6.25-15 MG/5ML PO SYRP: 5.0000 mL | ORAL_SOLUTION | Freq: Every evening | ORAL | 0 refills | Status: DC

## 2023-03-26 MED ORDER — MUPIROCIN 2 % EX OINT: 1.0000 | TOPICAL_OINTMENT | Freq: Two times a day (BID) | CUTANEOUS | 0 refills | Status: AC

## 2023-03-26 MED ORDER — CHLORHEXIDINE GLUCONATE 4 % EX SOLN: 1.0000 | CUTANEOUS | 1 refills | Status: DC

## 2023-03-26 MED ORDER — SODIUM CHLORIDE 0.9 % IR SOLN
Status: DC | PRN
  Administered 2023-03-26: 3000 mL

## 2023-03-26 MED ORDER — ONDANSETRON HCL 4 MG/2ML IJ SOLN
INTRAMUSCULAR | Status: AC
  Filled 2023-03-26: qty 2

## 2023-03-26 MED ORDER — SODIUM CHLORIDE 0.9 % IV SOLN: INTRAVENOUS | Status: DC

## 2023-03-26 MED ORDER — BUPIVACAINE-EPINEPHRINE 0.25% -1:200000 IJ SOLN
INTRAMUSCULAR | Status: AC
  Filled 2023-03-26: qty 1

## 2023-03-26 MED ORDER — METHOCARBAMOL 500 MG PO TABS: 500.0000 mg | ORAL_TABLET | Freq: Four times a day (QID) | ORAL | Status: DC | PRN

## 2023-03-26 MED ORDER — BUPIVACAINE LIPOSOME 1.3 % IJ SUSP: 20.0000 mL | Freq: Once | INTRAMUSCULAR | Status: DC

## 2023-03-26 MED ORDER — ORAL CARE MOUTH RINSE: 15.0000 mL | Freq: Once | OROMUCOSAL | Status: AC

## 2023-03-26 MED ORDER — SODIUM CHLORIDE (PF) 0.9 % IJ SOLN
INTRAMUSCULAR | Status: AC
  Filled 2023-03-26: qty 50

## 2023-03-26 MED ORDER — CEFAZOLIN SODIUM-DEXTROSE 2-4 GM/100ML-% IV SOLN
2.0000 g | INTRAVENOUS | Status: AC
  Administered 2023-03-26: 2 g via INTRAVENOUS
  Filled 2023-03-26: qty 100

## 2023-03-26 MED ORDER — ONDANSETRON HCL 4 MG PO TABS: 4.0000 mg | ORAL_TABLET | Freq: Three times a day (TID) | ORAL | 0 refills | Status: AC | PRN

## 2023-03-26 MED ORDER — METHOCARBAMOL 500 MG PO TABS
ORAL_TABLET | ORAL | Status: AC
  Administered 2023-03-26: 500 mg via ORAL
  Filled 2023-03-26: qty 1

## 2023-03-26 MED ORDER — ACETAMINOPHEN 500 MG PO TABS: 1000.0000 mg | ORAL_TABLET | Freq: Three times a day (TID) | ORAL | Status: AC | PRN

## 2023-03-26 MED ORDER — EPHEDRINE SULFATE-NACL 50-0.9 MG/10ML-% IV SOSY
PREFILLED_SYRINGE | INTRAVENOUS | Status: DC | PRN
  Administered 2023-03-26: 5 mg via INTRAVENOUS

## 2023-03-26 MED ORDER — ONDANSETRON HCL 4 MG PO TABS: 4.0000 mg | ORAL_TABLET | Freq: Four times a day (QID) | ORAL | Status: DC | PRN

## 2023-03-26 MED ORDER — ACETAMINOPHEN 325 MG PO TABS: 325.0000 mg | ORAL_TABLET | Freq: Four times a day (QID) | ORAL | Status: DC | PRN

## 2023-03-26 MED ORDER — MIDAZOLAM HCL 2 MG/2ML IJ SOLN
INTRAMUSCULAR | Status: AC
  Filled 2023-03-26: qty 2

## 2023-03-26 MED ORDER — LACTATED RINGERS IV BOLUS
500.0000 mL | Freq: Once | INTRAVENOUS | Status: AC
  Administered 2023-03-26: 500 mL via INTRAVENOUS

## 2023-03-26 MED ORDER — EPHEDRINE 5 MG/ML INJ
INTRAVENOUS | Status: AC
Start: 2023-03-26 — End: ?
  Filled 2023-03-26: qty 5

## 2023-03-26 MED ORDER — KETOROLAC TROMETHAMINE 30 MG/ML IJ SOLN
INTRAMUSCULAR | Status: AC
  Administered 2023-03-26: 30 mg via INTRAVENOUS
  Filled 2023-03-26: qty 1

## 2023-03-26 MED ORDER — SODIUM CHLORIDE 0.9 % IR SOLN: Status: DC | PRN

## 2023-03-26 MED ORDER — DEXAMETHASONE SODIUM PHOSPHATE 10 MG/ML IJ SOLN
8.0000 mg | Freq: Once | INTRAMUSCULAR | Status: AC
Start: 2023-03-26 — End: 2023-03-26
  Administered 2023-03-26: 10 mg via INTRAVENOUS

## 2023-03-26 MED ORDER — SODIUM CHLORIDE 0.9 % IV SOLN
INTRAVENOUS | Status: DC | PRN
  Administered 2023-03-26: 80 mL

## 2023-03-26 MED ORDER — OXYCODONE HCL 5 MG/5ML PO SOLN: 5.0000 mg | Freq: Once | ORAL | Status: DC | PRN

## 2023-03-26 MED ORDER — METHOCARBAMOL 1000 MG/10ML IJ SOLN: 500.0000 mg | Freq: Four times a day (QID) | INTRAMUSCULAR | Status: DC | PRN

## 2023-03-26 MED ORDER — CEFAZOLIN SODIUM-DEXTROSE 2-4 GM/100ML-% IV SOLN: 2.0000 g | Freq: Four times a day (QID) | INTRAVENOUS | Status: DC

## 2023-03-26 MED ORDER — PROPOFOL 500 MG/50ML IV EMUL
INTRAVENOUS | Status: AC
  Filled 2023-03-26: qty 50

## 2023-03-26 MED ORDER — MIDAZOLAM HCL 2 MG/2ML IJ SOLN
INTRAMUSCULAR | Status: DC | PRN
  Administered 2023-03-26: 2 mg via INTRAVENOUS

## 2023-03-26 MED ORDER — CELECOXIB 100 MG PO CAPS: 100.0000 mg | ORAL_CAPSULE | Freq: Two times a day (BID) | ORAL | 0 refills | Status: AC

## 2023-03-26 MED ORDER — HYDROMORPHONE HCL 1 MG/ML IJ SOLN: 0.2500 mg | INTRAMUSCULAR | Status: DC | PRN

## 2023-03-26 MED ORDER — BUPIVACAINE LIPOSOME 1.3 % IJ SUSP
INTRAMUSCULAR | Status: AC
  Filled 2023-03-26: qty 20

## 2023-03-26 MED ORDER — ASPIRIN 81 MG PO TBEC
81.0000 mg | DELAYED_RELEASE_TABLET | Freq: Two times a day (BID) | ORAL | Status: AC
Start: 2023-03-27 — End: 2023-04-24

## 2023-03-26 MED ORDER — ACETAMINOPHEN 500 MG PO TABS
1000.0000 mg | ORAL_TABLET | Freq: Once | ORAL | Status: AC
  Administered 2023-03-26: 1000 mg via ORAL

## 2023-03-26 MED ORDER — OXYCODONE HCL 5 MG PO TABS: 5.0000 mg | ORAL_TABLET | Freq: Once | ORAL | Status: DC | PRN

## 2023-03-26 MED ORDER — ONDANSETRON HCL 4 MG/2ML IJ SOLN
4.0000 mg | Freq: Once | INTRAMUSCULAR | Status: AC | PRN
  Administered 2023-03-26: 4 mg via INTRAVENOUS

## 2023-03-26 MED ORDER — ISOPROPYL ALCOHOL 70 % SOLN
Status: DC | PRN
  Administered 2023-03-26: 1 via TOPICAL

## 2023-03-26 MED ORDER — ONDANSETRON HCL 4 MG/2ML IJ SOLN: 4.0000 mg | Freq: Four times a day (QID) | INTRAMUSCULAR | Status: DC | PRN

## 2023-03-26 MED ORDER — POLYETHYLENE GLYCOL 3350 17 G PO PACK: 17.0000 g | PACK | Freq: Every day | ORAL | 0 refills | Status: DC

## 2023-03-26 MED ORDER — DEXAMETHASONE SODIUM PHOSPHATE 10 MG/ML IJ SOLN
INTRAMUSCULAR | Status: AC
  Filled 2023-03-26: qty 1

## 2023-03-26 MED ORDER — TRANEXAMIC ACID-NACL 1000-0.7 MG/100ML-% IV SOLN
1000.0000 mg | INTRAVENOUS | Status: AC
  Administered 2023-03-26: 1000 mg via INTRAVENOUS
  Filled 2023-03-26: qty 100

## 2023-03-26 MED ORDER — LACTATED RINGERS IV BOLUS
250.0000 mL | Freq: Once | INTRAVENOUS | Status: AC
  Administered 2023-03-26: 250 mL via INTRAVENOUS

## 2023-03-26 MED ORDER — HYDROMORPHONE HCL 1 MG/ML IJ SOLN: 0.5000 mg | INTRAMUSCULAR | Status: DC | PRN

## 2023-03-26 MED ORDER — METHOCARBAMOL 500 MG PO TABS: 500.0000 mg | ORAL_TABLET | Freq: Three times a day (TID) | ORAL | 0 refills | Status: AC | PRN

## 2023-03-26 MED ORDER — ACETAMINOPHEN 500 MG PO TABS
1000.0000 mg | ORAL_TABLET | Freq: Once | ORAL | Status: DC
  Filled 2023-03-26: qty 2

## 2023-03-26 MED ORDER — AMISULPRIDE (ANTIEMETIC) 5 MG/2ML IV SOLN: 10.0000 mg | Freq: Once | INTRAVENOUS | Status: AC | PRN

## 2023-03-26 MED ORDER — 0.9 % SODIUM CHLORIDE (POUR BTL) OPTIME
TOPICAL | Status: DC | PRN
  Administered 2023-03-26: 1000 mL

## 2023-03-26 MED ORDER — ACETAMINOPHEN 500 MG PO TABS: 1000.0000 mg | ORAL_TABLET | Freq: Four times a day (QID) | ORAL | Status: DC

## 2023-03-26 MED ORDER — OXYCODONE HCL 5 MG PO TABS
5.0000 mg | ORAL_TABLET | ORAL | Status: DC | PRN
Start: 2023-03-26 — End: 2023-03-26

## 2023-03-26 MED ORDER — FENTANYL CITRATE (PF) 100 MCG/2ML IJ SOLN
INTRAMUSCULAR | Status: DC | PRN
  Administered 2023-03-26: 100 ug via INTRAVENOUS

## 2023-03-26 MED ORDER — KETOROLAC TROMETHAMINE 15 MG/ML IJ SOLN: 15.0000 mg | Freq: Four times a day (QID) | INTRAMUSCULAR | Status: DC

## 2023-03-26 MED ORDER — KETOROLAC TROMETHAMINE 30 MG/ML IJ SOLN: 30.0000 mg | Freq: Once | INTRAMUSCULAR | Status: AC | PRN

## 2023-03-26 MED ORDER — LACTATED RINGERS IV SOLN
INTRAVENOUS | Status: DC
Start: 2023-03-26 — End: 2023-03-26

## 2023-03-26 MED ORDER — MEPERIDINE HCL 50 MG/ML IJ SOLN: 6.2500 mg | INTRAMUSCULAR | Status: DC | PRN

## 2023-03-26 MED ORDER — FENTANYL CITRATE (PF) 100 MCG/2ML IJ SOLN
INTRAMUSCULAR | Status: AC
  Filled 2023-03-26: qty 2

## 2023-03-26 MED ORDER — AMISULPRIDE (ANTIEMETIC) 5 MG/2ML IV SOLN
INTRAVENOUS | Status: AC
  Administered 2023-03-26: 10 mg via INTRAVENOUS
  Filled 2023-03-26: qty 4

## 2023-03-26 MED ORDER — ONDANSETRON HCL 4 MG/2ML IJ SOLN
INTRAMUSCULAR | Status: DC | PRN
  Administered 2023-03-26: 4 mg via INTRAVENOUS

## 2023-03-26 MED ORDER — POVIDONE-IODINE 10 % EX SWAB: 2.0000 | Freq: Once | CUTANEOUS | Status: DC

## 2023-03-26 SURGICAL SUPPLY — 54 items
BAG COUNTER SPONGE SURGICOUNT (BAG) IMPLANT
BLADE SAG 18X100X1.27 (BLADE) ×1 IMPLANT
BLADE SAW SAG 35X64 .89 (BLADE) ×1 IMPLANT
BLADE SAW SGTL 11.0X1.19X90.0M (BLADE) IMPLANT
BNDG COHESIVE 3X5 TAN ST LF (GAUZE/BANDAGES/DRESSINGS) ×1 IMPLANT
BNDG ELASTIC 6X10 VLCR STRL LF (GAUZE/BANDAGES/DRESSINGS) ×1 IMPLANT
BOWL SMART MIX CTS (DISPOSABLE) ×1 IMPLANT
CEMENT BONE R 1X40 (Cement) IMPLANT
CEMENT BONE REFOBACIN R1X40 US (Cement) IMPLANT
CHLORAPREP W/TINT 26 (MISCELLANEOUS) ×2 IMPLANT
COMP FEM KNEE PS NRW 8 LT (Joint) ×1 IMPLANT
COMP PATELLA PEG 3 32 (Joint) ×1 IMPLANT
COMPONENT FEM KNEE PS NRW 8 LT (Joint) IMPLANT
COMPONENT PATELLA PEG 3 32 (Joint) IMPLANT
COVER SURGICAL LIGHT HANDLE (MISCELLANEOUS) ×1 IMPLANT
CUFF TRNQT CYL 34X4.125X (TOURNIQUET CUFF) ×1 IMPLANT
DERMABOND ADVANCED .7 DNX12 (GAUZE/BANDAGES/DRESSINGS) ×1 IMPLANT
DRAPE INCISE IOBAN 85X60 (DRAPES) ×1 IMPLANT
DRAPE SHEET LG 3/4 BI-LAMINATE (DRAPES) ×1 IMPLANT
DRAPE U-SHAPE 47X51 STRL (DRAPES) ×1 IMPLANT
DRSG AQUACEL AG ADV 3.5X10 (GAUZE/BANDAGES/DRESSINGS) ×1 IMPLANT
ELECT REM PT RETURN 15FT ADLT (MISCELLANEOUS) ×1 IMPLANT
GAUZE SPONGE 4X4 12PLY STRL (GAUZE/BANDAGES/DRESSINGS) ×1 IMPLANT
GLOVE BIO SURGEON STRL SZ 6.5 (GLOVE) ×2 IMPLANT
GLOVE BIOGEL PI IND STRL 6.5 (GLOVE) ×1 IMPLANT
GLOVE BIOGEL PI IND STRL 8 (GLOVE) ×1 IMPLANT
GLOVE SURG ORTHO 8.0 STRL STRW (GLOVE) ×2 IMPLANT
GOWN STRL REUS W/ TWL XL LVL3 (GOWN DISPOSABLE) ×2 IMPLANT
HOLDER FOLEY CATH W/STRAP (MISCELLANEOUS) ×1 IMPLANT
HOOD PEEL AWAY T7 (MISCELLANEOUS) ×3 IMPLANT
INSERT MED AS PERS SZ 8-11 LT (Insert) IMPLANT
KIT TURNOVER KIT A (KITS) IMPLANT
MANIFOLD NEPTUNE II (INSTRUMENTS) ×1 IMPLANT
MARKER SKIN DUAL TIP RULER LAB (MISCELLANEOUS) ×1 IMPLANT
NS IRRIG 1000ML POUR BTL (IV SOLUTION) ×1 IMPLANT
PACK TOTAL KNEE CUSTOM (KITS) ×1 IMPLANT
PIN DRILL HDLS TROCAR 75 4PK (PIN) IMPLANT
SCREW HEADED 33MM KNEE (MISCELLANEOUS) IMPLANT
SET HNDPC FAN SPRY TIP SCT (DISPOSABLE) ×1 IMPLANT
SOLUTION IRRIG SURGIPHOR (IV SOLUTION) IMPLANT
SPIKE FLUID TRANSFER (MISCELLANEOUS) ×1 IMPLANT
STEM TIB PERS SZ E 5D LT (Screw) IMPLANT
STRIP CLOSURE SKIN 1/2X4 (GAUZE/BANDAGES/DRESSINGS) ×1 IMPLANT
SUT MNCRL AB 3-0 PS2 18 (SUTURE) ×1 IMPLANT
SUT STRATAFIX 0 PDS 27 VIOLET (SUTURE) ×1
SUT STRATAFIX 14 PDO 48 VLT (SUTURE) ×1 IMPLANT
SUT STRATAFIX PDO 1 14 VIOLET (SUTURE) ×1
SUT VIC AB 2-0 CT2 27 (SUTURE) ×2 IMPLANT
SUTURE STRATFX 0 PDS 27 VIOLET (SUTURE) ×1 IMPLANT
SYR 50ML LL SCALE MARK (SYRINGE) ×1 IMPLANT
TRAY FOLEY MTR SLVR 14FR STAT (SET/KITS/TRAYS/PACK) IMPLANT
TUBE SUCTION HIGH CAP CLEAR NV (SUCTIONS) ×1 IMPLANT
UNDERPAD 30X36 HEAVY ABSORB (UNDERPADS AND DIAPERS) ×1 IMPLANT
WRAP KNEE MAXI GEL POST OP (GAUZE/BANDAGES/DRESSINGS) IMPLANT

## 2023-03-26 NOTE — Anesthesia Procedure Notes (Signed)
 Spinal  Patient location during procedure: OR Start time: 03/26/2023 7:20 AM End time: 03/26/2023 7:25 AM Reason for block: surgical anesthesia Staffing Performed: anesthesiologist  Anesthesiologist: Lannie Fields, DO Performed by: Lannie Fields, DO Authorized by: Lannie Fields, DO   Preanesthetic Checklist Completed: patient identified, IV checked, risks and benefits discussed, surgical consent, monitors and equipment checked, pre-op evaluation and timeout performed Spinal Block Patient position: sitting Prep: DuraPrep and site prepped and draped Patient monitoring: cardiac monitor, continuous pulse ox and blood pressure Approach: midline Location: L3-4 Injection technique: single-shot Needle Needle type: Pencan  Needle gauge: 24 G Needle length: 9 cm Assessment Sensory level: T6 Events: CSF return Additional Notes Functioning IV was confirmed and monitors were applied. Sterile prep and drape, including hand hygiene and sterile gloves were used. The patient was positioned and the spine was prepped. The skin was anesthetized with lidocaine.  Free flow of clear CSF was obtained prior to injecting local anesthetic into the CSF.  The spinal needle aspirated freely following injection.  The needle was carefully withdrawn.  The patient tolerated the procedure well.

## 2023-03-26 NOTE — Anesthesia Procedure Notes (Signed)
 Anesthesia Regional Block: Adductor canal block   Pre-Anesthetic Checklist: , timeout performed,  Correct Patient, Correct Site, Correct Laterality,  Correct Procedure, Correct Position, site marked,  Risks and benefits discussed,  Surgical consent,  Pre-op evaluation,  At surgeon's request and post-op pain management  Laterality: Left  Prep: Maximum Sterile Barrier Precautions used, chloraprep       Needles:  Injection technique: Single-shot  Needle Type: Echogenic Stimulator Needle     Needle Length: 9cm  Needle Gauge: 22     Additional Needles:   Procedures:,,,, ultrasound used (permanent image in chart),,    Narrative:  Start time: 03/26/2023 7:00 AM End time: 03/26/2023 7:05 AM Injection made incrementally with aspirations every 5 mL.  Performed by: Personally  Anesthesiologist: Lannie Fields, DO  Additional Notes: Monitors applied. No increased pain on injection. No increased resistance to injection. Injection made in 5cc increments. Good needle visualization. Patient tolerated procedure well.

## 2023-03-26 NOTE — Discharge Instructions (Addendum)

## 2023-03-26 NOTE — Transfer of Care (Signed)
 Immediate Anesthesia Transfer of Care Note  Patient: Brenda Mcneil  Procedure(s) Performed: TOTAL KNEE ARTHROPLASTY (Left: Knee)  Patient Location: PACU  Anesthesia Type:Spinal  Level of Consciousness: awake, alert , and oriented  Airway & Oxygen Therapy: Patient Spontanous Breathing  Post-op Assessment: Report given to RN and Post -op Vital signs reviewed and stable  Post vital signs: Reviewed and stable  Last Vitals:  Vitals Value Taken Time  BP 126/79 03/26/23 1001  Temp    Pulse 75 03/26/23 1006  Resp 22 03/26/23 1006  SpO2 100 % 03/26/23 1006  Vitals shown include unfiled device data.  Last Pain:  Vitals:   03/26/23 0551  TempSrc:   PainSc: 0-No pain      Patients Stated Pain Goal: 3 (03/13/23 1118)  Complications: No notable events documented.

## 2023-03-26 NOTE — Op Note (Signed)
 DATE OF SURGERY:  03/26/2023 TIME: 9:21 AM  PATIENT NAME:  Brenda Mcneil   AGE: 33 y.o.    PRE-OPERATIVE DIAGNOSIS:  Posttraumatic left knee osteoarthritis  POST-OPERATIVE DIAGNOSIS:  Same  PROCEDURE: Press-fit left total Knee Arthroplasty  SURGEON:  Rohnan Bartleson A Josiah Wojtaszek, MD   ASSISTANT: Kathie Dike, PA-C, present and scrubbed throughout the case, critical for assistance with exposure, retraction, instrumentation, and closure.   OPERATIVE IMPLANTS:  Press-fit Zimmer persona size left 8 narrow femur, left size E press-fit tibial baseplate, size 11 MC polyethylene insert, 32 mm press-fit [place] Ti patella Implant Name Type Inv. Item Serial No. Manufacturer Lot No. LRB No. Used Action  COMP PATELLA PEG 3 32 - [id number] Joint COMP PATELLA PEG 3 32  [employer](ORTH,TRAU,BIO,SG) [id number] Left 1 Implanted  STEM TIB PERS SZ E 5D LT - [id number] Screw STEM TIB PERS SZ E 5D LT  [employer](ORTH,TRAU,BIO,SG) [id number] Left 1 Implanted  COMP FEM KNEE PS NRW 8 LT - [id number] Joint COMP FEM KNEE PS NRW 8 LT  [employer](ORTH,TRAU,BIO,SG) [id number] Left 1 Implanted  INSERT MED AS PERS SZ 8-11 LT - [id number] Insert INSERT MED AS PERS SZ 8-11 LT  [employer](ORTH,TRAU,BIO,SG) [id number] Left 1 Implanted      PREOPERATIVE INDICATIONS:  Brenda Mcneil is a 33 y.o. 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   The risks, benefits, and alternatives were discussed at length including but not limited to the risks of infection, bleeding, nerve injury, stiffness, blood clots, the need for revision surgery,  cardiopulmonary complications, among others, and they were willing to proceed.  OPERATIVE FINDINGS AND UNIQUE ASPECTS OF THE CASE: Chronic ACL deficiency  ESTIMATED BLOOD LOSS: 50cc  OPERATIVE DESCRIPTION:   Once adequate anesthesia was induced, preoperative antibiotics, 2 gm of ancef,1 gm of Tranexamic Acid, and 8 mg of Decadron administered, the patient was positioned supine with a left thigh tourniquet placed.  The left lower extremity was prepped and draped in sterile fashion.  A time-  out was performed identifying the patient, planned procedure, and the appropriate extremity.     The leg was  exsanguinated, tourniquet elevated to 250 mmHg.  A midline incision was  made followed by median parapatellar arthrotomy. Anterior horn of the medial meniscus was released and resected. A medial release was performed, the infrapatellar fat pad was resected with care taken to protect the patellar tendon. The suprapatellar fat was removed to exposed the distal anterior femur. The anterior horn of the lateral meniscus and ACL were released.    Following initial  exposure, I first started with the femur  The femoral  canal was opened with a drill, canal was suctioned to try to prevent fat emboli.  An  intramedullary rod was passed set at 5 degrees valgus, 10 mm. The distal femur was resected.  Following this resection, the tibia was  subluxated anteriorly.  Using the extramedullary guide, 10 mm of bone was resected off   the proximal lateral tibia.  We confirmed the gap would be  stable medially and laterally with a size 10 spacer block as well as confirmed that the tibial cut was perpendicular in  the coronal plane, checking with an alignment rod.    Once this was done, the posterior femoral referencing femoral sizer was placed under to the posterior condyles with 3 degrees of external rotational which was parallel to the transepicondylar axis and perpendicular to [employer]. The femur was sized to be a  size 8 in the anterior-  posterior dimension. The  anterior, posterior, and  chamfer cuts were made without difficulty nor   notching making certain that I was along the anterior cortex to help  with flexion gap stability. Next a laminar spreader was placed with the knee in flexion and the medial lateral menisci were resected.  5 cc of the Exparel mixture was injected in the medial side of the back of the knee and 3 cc in the lateral side.  1/2 inch curved osteotome was used to resect posterior osteophyte that was then removed with a pituitary rongeur.       At this point, the tibia was sized to be a size E.  The size E tray was  then pinned in position. Trial reduction was now carried with a 8 femur, E tibia, a 10 mm MC insert.  The knee had full extension and was stable to varus valgus stress in extension; however it was relatively tight in extension even after releasing the PCL.  Elected to recut the tibia and add additional slope to the tibia.  Trial components were then reapplied.  An 11 mm MC insert was stable in extension as well as improved mobility and flexion.  Attention was next directed to the patella.  Precut  measurement was noted to be 24 mm.  I resected down to 14 mm and used a  32mm patellar button to restore patellar height as well as cover the cut surface.     The patella lug holes were drilled and a 32mm patella poly trial was placed.    The knee was brought to full extension with good flexion stability with the patella tracking through the trochlea without application of pressure.    Next the femoral component was again assessed and determined to be seated and appropriately lateralized.  The femoral lug holes were drilled.  The femoral component was then removed. Tibial component was again assessed and felt to be seated and appropriately rotated with the medial third of the tubercle. The tibia was then drilled, and keel punched.     Final components were  opened and impacted into  place.   The knee was irrigated with sterile Betadine diluted in saline as well as pulse lavage normal saline. The synovial lining was  then injected a dilute Exparel with 30cc of 0.25% marcaine with epinephrine.     I confirmed that I was satisfied with the range of motion and stability, and the final 11mm MC poly insert was chosen.  It was placed into the knee.         The tourniquet had been let down at 74 minutes.  No significant hemostasis was required.  The medial parapatellar arthrotomy was then reapproximated using  #1 Stratafix sutures with the knee  in flexion.  The remaining wound was closed with 0 stratafix, 2-0 Vicryl, and running 3-0 Monocryl. The knee was cleaned, dried, dressed sterilely using Dermabond and   Aquacel dressing.  The patient was then brought to recovery room in stable condition, tolerating the procedure  well. There were no complications.   Post op recs: WB: WBAT Abx: ancef Imaging: PACU xrays DVT prophylaxis:  Aspirin 81mg  BID x4 weeks Follow up: 2 weeks after surgery for a wound check with Dr. Blanchie Dessert at [facility].  Address: [address], [place], [place] [address]  Office Phone: [phone]  Weber Cooks, MD Orthopaedic Surgery

## 2023-03-26 NOTE — Interval H&P Note (Signed)

## 2023-03-26 NOTE — Progress Notes (Signed)
 Orthopedic Tech Progress Note Patient Details:  Brenda Mcneil July 30, 1990 [id number]  Ortho Devices Type of Ortho Device: Bone foam zero knee Ortho Device/Splint Location: LLE Ortho Device/Splint Interventions: Ordered, Application, Adjustment   Post Interventions Patient Tolerated: Well Instructions Provided: Care of device  [place] A Gerilyn Pilgrim 03/26/2023, 11:08 AM

## 2023-03-26 NOTE — Evaluation (Signed)
 Physical Therapy Evaluation Patient Details Name: Brenda Mcneil MRN: [id number] DOB: [birth year] Today's Date: 03/26/2023  History of Present Illness  33 yo female presents to therapy s/p L TKA on 03/26/2023 due to failure of conservative measures. Pt has PMH including but not limited to ACL reconstruction and repair.  Clinical Impression      Brenda Mcneil is a 33 y.o. female POD 0 s/p L TKA. Patient reports IND with mobility at baseline. Patient is now limited by functional impairments (see PT problem list below) and requires CGA and cues for transfers and gait with RW. Patient was able to ambulate 40 and 20 feet with RW and CGA and cues for safe walker management. Patient educated on safe sequencing for stair mobility with both handrails, fall risk prevention, use of RW, fall risk prevention, pain management and goal, use of CP/ice and car transfers pt and family member verbalized understanding of safe guarding position for people assisting with mobility. Patient instructed in exercises to facilitate ROM and circulation reviewed and HO provided. Patient will benefit from continued skilled PT interventions to address impairments and progress towards PLOF. Patient has met mobility goals at adequate level for discharge home with family support and OPPT services scheduled for 2/27; will continue to follow if pt continues acute stay to progress towards Mod I goals.     If plan is discharge home, recommend the following: A little help with walking and/or transfers;A little help with bathing/dressing/bathroom;Assistance with cooking/housework;Assist for transportation;Help with stairs or ramp for entrance   Can travel by private vehicle        Equipment Recommendations Rolling walker (2 wheels)  Recommendations for Other Services       Functional Status Assessment Patient has had a recent decline in their functional status and demonstrates the ability to make significant improvements in function in a  reasonable and predictable amount of time.     Precautions / Restrictions Precautions Precautions: Knee;Fall Restrictions Weight Bearing Restrictions Per Provider Order: Yes LLE Weight Bearing Per Provider Order: Weight bearing as tolerated      Mobility  Bed Mobility Overal bed mobility: Needs Assistance Bed Mobility: Supine to Sit     Supine to sit: Supervision, HOB elevated     General bed mobility comments: min cues    Transfers Overall transfer level: Needs assistance Equipment used: Rolling walker (2 wheels) Transfers: Sit to/from Stand Sit to Stand: Contact guard assist           General transfer comment: min cues for proper UE and AD placement    Ambulation/Gait Ambulation/Gait assistance: Contact guard assist Gait Distance (Feet): 40 Feet Assistive device: Rolling walker (2 wheels) Gait Pattern/deviations: Step-to pattern, Antalgic, Trunk flexed Gait velocity: decreased     General Gait Details: slight trunk flexion and B UE support at RW to offload L LE in stance phase, step to pattern, min cues for use of RW pt indicated prior to L TKA pt was in PF on L at all times and feels diffrent to be able to put heel down  Stairs Stairs: Yes Stairs assistance: Contact guard assist Stair Management: Two rails Number of Stairs: 5 General stair comments: step navigation with min cues and step to pattern pt encouraged to rest as needed on landings between flights to access apartment  Wheelchair Mobility     Tilt Bed    Modified Rankin (Stroke Patients Only)       Balance Overall balance assessment: Needs assistance Sitting-balance support: Feet supported Sitting balance-Leahy  Scale: Good     Standing balance support: Bilateral upper extremity supported, During functional activity, Reliant on assistive device for balance Standing balance-Leahy Scale: Poor                               Pertinent Vitals/Pain Pain Assessment Pain  Assessment: 0-10 Pain Score: 4  Pain Location: L knee Pain Descriptors / Indicators: Aching, Constant, Discomfort, Dull, Grimacing, Operative site guarding Pain Intervention(s): Limited activity within patient's tolerance, Monitored during session, Premedicated before session, Repositioned, Ice applied    Home Living Family/patient expects to be discharged to:: Private residence Living Arrangements: Spouse/significant other;Children Available Help at Discharge: Family Type of Home: Apartment Home Access: Stairs to enter Entrance Stairs-Rails: Right;Left;Can reach both Secretary/administrator of Steps: 4 flights of steps to access 3rd floor apartment   Home Layout: One level Home Equipment: Cane - single point      Prior Function Prior Level of Function : Independent/Modified Independent;Driving;Working/employed             Mobility Comments: IND with all ADLs, self care tasks, IADLs ADLs Comments: pt works as a Customer service manager at [employer]        Lower Extremity Assessment Lower Extremity Assessment: LLE deficits/detail LLE Deficits / Details: ankle DF/PF 5/5; SLR < 10 degree lag LLE Sensation: decreased light touch;decreased proprioception (B LE)    Cervical / Trunk Assessment Cervical / Trunk Assessment: Normal  Communication   Communication Communication: No apparent difficulties    Cognition Arousal: Alert Behavior During Therapy: WFL for tasks assessed/performed   PT - Cognitive impairments: No apparent impairments                         Following commands: Intact       Cueing       General Comments      Exercises Total Joint Exercises Ankle Circles/Pumps: AROM, Both, 15 reps Quad Sets: AROM, Left, 5 reps Short Arc Quad: AROM, Left, 5 reps Heel Slides: AROM, Left, 5 reps Hip ABduction/ADduction: AROM, Left, 5 reps Straight Leg Raises: AROM, Left, 5 reps Knee Flexion: AROM, Left, 5 reps, Seated    Assessment/Plan    PT Assessment Patient needs continued PT services  PT Problem List Decreased strength;Decreased range of motion;Decreased activity tolerance;Decreased mobility;Decreased balance;Decreased coordination;Pain       PT Treatment Interventions DME instruction;Gait training;Stair training;Functional mobility training;Therapeutic activities;Therapeutic exercise;Balance training;Neuromuscular re-education;Patient/family education;Modalities    PT Goals (Current goals can be found in the Care Plan section)  Acute Rehab PT Goals Patient Stated Goal: be able to go up and down the steps and walk [place] distances no pain PT Goal Formulation: With patient Time For Goal Achievement: 04/09/23 Potential to Achieve Goals: Good    Frequency 7X/week     Co-evaluation               AM-PAC PT "6 Clicks" Mobility  Outcome Measure Help needed turning from your back to your side while in a flat bed without using bedrails?: None Help needed moving from lying on your back to sitting on the side of a flat bed without using bedrails?: A Little Help needed moving to and from a bed to a chair (including a wheelchair)?: A Little Help needed standing up from a chair using your arms (e.g., wheelchair or bedside chair)?: A Little Help needed to walk in hospital  room?: A Little Help needed climbing 3-5 steps with a railing? : A Little 6 Click Score: 19    End of Session Equipment Utilized During Treatment: Gait belt Activity Tolerance: Patient tolerated treatment well Patient left: in chair;with call bell/phone within reach;with family/visitor present Nurse Communication: Mobility status;Other (comment) (pt readiness for d/c from PT standpoint) PT Visit Diagnosis: Unsteadiness on feet (R26.81);Other abnormalities of gait and mobility (R26.89);Muscle weakness (generalized) (M62.81);Difficulty in walking, not elsewhere classified (R26.2);Pain Pain - Right/Left: Left Pain - part of body:  Knee;Leg    Time: 1610-9604 PT Time Calculation (min) (ACUTE ONLY): 46 min   Charges:   PT Evaluation $PT Eval Low Complexity: 1 Low PT Treatments $Gait Training: 8-22 mins $Therapeutic Exercise: 8-22 mins PT General Charges $$ ACUTE PT VISIT: 1 Visit         Johnny Bridge, PT Acute Rehab   Jacqualyn Posey 03/26/2023, 1:00 PM

## 2023-03-26 NOTE — Anesthesia Postprocedure Evaluation (Signed)
 Anesthesia Post Note  Patient: [name]  Procedure(s) Performed: TOTAL KNEE ARTHROPLASTY (Left: Knee)     Patient location during evaluation: PACU Anesthesia Type: Regional, MAC and Spinal Level of consciousness: awake and alert Pain management: pain level controlled Vital Signs Assessment: post-procedure vital signs reviewed and stable Respiratory status: spontaneous breathing, nonlabored ventilation and respiratory function stable Cardiovascular status: blood pressure returned to baseline and stable Postop Assessment: no apparent nausea or vomiting, no headache, no backache and spinal receding Anesthetic complications: no   No notable events documented.  Last Vitals:  Vitals:   03/26/23 1045 03/26/23 1100  BP: (!) 105/92 118/80  Pulse: 71 70  Resp: 11 16  Temp: 36.5 C   SpO2: 100% 100%    Last Pain:  Vitals:   03/26/23 1100  TempSrc:   PainSc: 2                  Brenda Mcneil

## 2023-03-27 ENCOUNTER — Encounter (HOSPITAL_COMMUNITY): Payer: Self-pay | Admitting: Orthopedic Surgery

## 2023-03-29 ENCOUNTER — Other Ambulatory Visit: Payer: Self-pay

## 2023-03-29 ENCOUNTER — Encounter: Payer: Self-pay | Admitting: Physical Therapy

## 2023-03-29 ENCOUNTER — Ambulatory Visit: Payer: Medicaid Other | Attending: Orthopedic Surgery | Admitting: Physical Therapy

## 2023-03-29 DIAGNOSIS — R600 Localized edema: Secondary | ICD-10-CM | POA: Diagnosis present

## 2023-03-29 DIAGNOSIS — M6281 Muscle weakness (generalized): Secondary | ICD-10-CM | POA: Insufficient documentation

## 2023-03-29 DIAGNOSIS — R2681 Unsteadiness on feet: Secondary | ICD-10-CM | POA: Diagnosis present

## 2023-03-29 DIAGNOSIS — M25562 Pain in left knee: Secondary | ICD-10-CM | POA: Diagnosis present

## 2023-03-29 NOTE — Therapy (Signed)
 OUTPATIENT PHYSICAL THERAPY LOWER EXTREMITY EVALUATION  Patient Name: Brenda Mcneil MRN: [id number] DOB:Jul 15, 1990, 33 y.o., female Today's Date: 03/29/2023   PT End of Session - 03/29/23 1027     Visit Number 1    Number of Visits --   1-2x/week   Date for PT Re-Evaluation 06/21/23    Authorization Type  MCD    Authorization Time Period sumbitted    PT Start Time 0830    PT Stop Time 0912    PT Time Calculation (min) 42 min             Past Medical History:  Diagnosis Date   Anemia    Biological false positive RPR test 03/02/2020   RPR reactive on 28 week labs on 02/24/20. With  Negative Tpal and titer 1:1, this is most likely false positive.  Repeat RPR in third trimester or when admitted to L&D. RPR NR at 37weeks.   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/26/2023   Procedure: TOTAL KNEE ARTHROPLASTY;  Surgeon: Joen Laura, MD;  Location: [facility];  Service: Orthopedics;  Laterality: Left;   Patient Active Problem List   Diagnosis Date Noted   Postpartum care following vaginal delivery 05/26/2020   Contraception management 05/26/2020    PCP: Aura Camps, FNP  REFERRING PROVIDER: Joen Laura, MD  THERAPY DIAG:  Left knee pain, unspecified chronicity  Muscle weakness  Unsteadiness on feet  Localized edema  REFERRING DIAG: L TKA 2/24  Rationale for Evaluation and Treatment:  Rehabilitation  SUBJECTIVE:  PERTINENT PAST HISTORY:  Multiple past ACL tears        PRECAUTIONS: None  WEIGHT BEARING RESTRICTIONS No  FALLS:  Has patient fallen in last 6 months? No, Number of falls: 0  MOI/History of condition:  Onset date: 2/24  SUBJECTIVE STATEMENT  Brenda Mcneil is a 33 y.o. female who presents to clinic with chief complaint of L knee  pain and stiffness following TKA on 2/24.  She has a history of ACL reconstruction, one in 2006 and one in 2010.  Both grafts failed which resulted in  arthritis, ultimately leading to L TKA on 2/24.   Red flags:  denies malaise, erythema / streaking, and chills / night sweats  Pain:  Are you having pain? 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,000 steps  Assistive Device: FWW, SPC, no AD at baseline  Hand Dominance: na  Patient Goals/Specific Activities: reduce pain, walking large amounts, lifting, stairs   OBJECTIVE:   DIAGNOSTIC FINDINGS:  na  GENERAL OBSERVATION/GAIT: Slow antalgic gait using FWW  SENSATION: Light touch: Appears intact  PALPATION: Expected edema about L knee with mild lateral bruising at proximal tibia, dressing in place and clean  LE MMT:  MMT Right (Eval) Left (Eval)  Hip flexion (L2, L3) 4 3-*  Knee extension (L3) 4+ 3-*  Knee flexion 4+ 3-*  Hip abduction    Hip extension    Hip external rotation    Hip internal rotation    Hip adduction    Ankle dorsiflexion (L4)    Ankle plantarflexion (S1)  Ankle inversion    Ankle eversion    Great Toe ext (L5)    Grossly     (Blank rows = not tested, score listed is out of 5 possible points.  N = WNL, D = diminished, C = clear for gross weakness with myotome testing, * = concordant pain with testing)  LE ROM:  ROM Right (Eval) Left (Eval)  Hip flexion    Hip extension    Hip abduction    Hip adduction    Hip internal rotation    Hip external rotation    Knee extension -4 Lacking 4  Knee flexion 130 20 degrees  Ankle dorsiflexion    Ankle plantarflexion    Ankle inversion    Ankle eversion     (Blank rows = not tested, N = WNL, * = concordant pain with  testing)  Functional Tests  Eval                                                              PATIENT SURVEYS:  LEFS: 13/80   TODAY'S TREATMENT: Therapeutic Exercise: Creating, reviewing, and completing below HEP   PATIENT EDUCATION ([place]/[facility]):  POC, diagnosis, prognosis, HEP, and outcome measures.  Pt educated via explanation, demonstration, and handout (HEP).  Pt confirms understanding verbally.  Discussed signs of DVT and infection.  Benefits of consistent movement and frequent walking.  Recommendation for elevation and ice  HOME EXERCISE PROGRAM: Access Code: [email or web address] URL: [email or web address] Date: 03/29/2023 Prepared by: [name]  Exercises - Supine Ankle Pumps  - 8 x daily - 7 x weekly - 2 sets - 10 reps - Supine Heel Slide with Strap  - 2-3 x daily - 7 x weekly - 3 sets - 10 reps - Seated Hamstring Stretch  - 2-3 x daily - 7 x weekly - 1 sets - 3 reps - 45 hold - Seated Knee Flexion Extension AAROM with Overpressure  - 2-3 x daily - 7 x weekly - 3 sets - 10 reps - [place] Sitting 4 Way Patellar Glide  - 4 x daily - 7 x weekly - 3 sets - 10 reps - Supine Quad Set  - 5 x daily - 7 x weekly - 2 sets - 10 reps - 10 hold - Ice  - 5 x daily - 7 x weekly - 1 sets - 1 reps - 20 min hold  Treatment priorities   Eval                                                  ASSESSMENT:  CLINICAL IMPRESSION: Brenda Mcneil is a 33 y.o. female who presents to clinic with signs and sxs consistent with expected L knee pain and stiffness following TKA on 2/24.  Her ext ROM looks very good.  She is quite limited in her flexion at  this point.  Pt will benefit from skilled therapy to address relevant deficits and return to active job.    OBJECTIVE IMPAIRMENTS: Pain, knee ROM, knee strength, gait, balance  ACTIVITY LIMITATIONS: walking, steps, squatting, bending, lifting, working  PERSONAL FACTORS: See medical history and pertinent history   REHAB  POTENTIAL:  Good  CLINICAL DECISION MAKING: Stable/uncomplicated  EVALUATION COMPLEXITY: Low   GOALS:   SHORT TERM GOALS: Target date: 04/26/2023   Brenda Mcneil will be >75% HEP compliant to improve carryover between sessions and facilitate independent management of condition  Evaluation: ongoing Goal status: INITIAL   [place] TERM GOALS: Target date: 06/21/2023   Brenda Mcneil will self report </= 3/10 pain during daily activities from evaluation to improve function in daily tasks  Evaluation/Baseline: 7/10 max pain Goal status: INITIAL   2.  Brenda Mcneil will improve 10 meter max gait speed to 1.4 m/s (.1 m/s MCID) to show functional improvement in ambulation   Evaluation/Baseline: not tested Goal status: INITIAL   Norms:     3.  Brenda Mcneil will improve 30'' STS (MCID 2) to >/= 12x (w/ UE?: N) to show improved LE strength and improved transfers   Evaluation/Baseline: not tested Goal status: INITIAL   4.  Brenda Mcneil will show a >/= 36 pt improvement in LEFS score (MCID is ~11% or 9 pts) as a proxy for functional improvement   Evaluation/Baseline: 13 pts Goal status: INITIAL   5.  Brenda Mcneil will achieve 115 degrees knee flexion to improve ability to complete transfers, squat, and navigate steps  Evaluation/Baseline: 20 degrees Goal status: INITIAL   6.  Brenda Mcneil will achieve 0 degrees knee extension to improve mechanics of gait  Evaluation/Baseline: lacking 4 degrees Goal status: INITIAL  7. Brenda Mcneil will be able to return to work, not limited by pain  Evaluation/Baseline: limited Goal status: INITIAL    PLAN: PT FREQUENCY: 1-2x/week  PT DURATION: 8 weeks  PLANNED INTERVENTIONS:  97164- PT Re-evaluation, 97110-Therapeutic exercises, 97530- Therapeutic activity, [id number]- Neuromuscular re-education, 97535- Self Care, [id number]- Manual therapy, [id number]- Gait training, [id number]- Aquatic Therapy, [phone]- Electrical stimulation (manual), [id number]- Vasopneumatic device, [id number]- Traction  (mechanical), [id number]- Ionotophoresis 4mg /ml Dexamethasone, Taping, Dry Needling, Joint manipulation, and Spinal manipulation.   [name] PT, DPT 03/29/2023, 10:35 AM  I just finished a MCD eval/recert.  Name: Demeka Sutter  MRN: [id number] Please request 2x/week for 8 weeks.  Check all conditions that are expected to impact treatment: Musculoskeletal disorders   Check all possible CPT codes: 86578- Therapeutic Exercise, [phone]- Neuro Re-education, [phone] - Gait Training, [phone] - Manual Therapy, 97530 - Therapeutic Activities, 97535 - Self Care, [phone] - Re-evaluation, [id number] - Mechanical traction, and [phone] - Aquatic therapy   Thank you!  MCD - Secure

## 2023-03-29 NOTE — Addendum Note (Signed)
 Addended by: Fredderick Phenix on: 03/29/2023 12:16 PM   Modules accepted: Orders

## 2023-04-03 ENCOUNTER — Ambulatory Visit: Payer: Medicaid Other | Attending: Orthopedic Surgery | Admitting: Physical Therapy

## 2023-04-03 ENCOUNTER — Encounter: Payer: Self-pay | Admitting: Physical Therapy

## 2023-04-03 DIAGNOSIS — R600 Localized edema: Secondary | ICD-10-CM | POA: Insufficient documentation

## 2023-04-03 DIAGNOSIS — M25562 Pain in left knee: Secondary | ICD-10-CM | POA: Diagnosis present

## 2023-04-03 DIAGNOSIS — M6281 Muscle weakness (generalized): Secondary | ICD-10-CM | POA: Diagnosis present

## 2023-04-03 DIAGNOSIS — R2681 Unsteadiness on feet: Secondary | ICD-10-CM | POA: Diagnosis present

## 2023-04-03 NOTE — Therapy (Signed)
 OUTPATIENT PHYSICAL THERAPY DAILY NOTE  Patient Name: Brenda Mcneil MRN: [id number] DOB:06/04/1990, 33 y.o., female Today's Date: 04/03/2023   PT End of Session - 04/03/23 0829     Visit Number 2    Number of Visits --   1-2x/week   Date for PT Re-Evaluation 06/21/23    Authorization Type [place] MCD    Authorization Time Period submitted    PT Start Time 0830    PT Stop Time 0912    PT Time Calculation (min) 42 min             Past Medical History:  Diagnosis Date   Anemia    Biological false positive RPR test 03/02/2020   RPR reactive on 28 week labs on 02/24/20. With  Negative Tpal and titer 1:1, this is most likely false positive.  Repeat RPR in third trimester or when admitted to L&D. RPR NR at 37weeks.   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/26/2023   Procedure: TOTAL KNEE ARTHROPLASTY;  Surgeon: Joen Laura, MD;  Location: [facility];  Service: Orthopedics;  Laterality: Left;   Patient Active Problem List   Diagnosis Date Noted   Postpartum care following vaginal delivery 05/26/2020   Contraception management 05/26/2020    PCP: Aura Camps, FNP  REFERRING PROVIDER: Joen Laura, MD  THERAPY DIAG:  Left knee pain, unspecified chronicity  Muscle weakness  Unsteadiness on feet  Localized edema  REFERRING DIAG: L TKA 2/24  Rationale for Evaluation and Treatment:  Rehabilitation  SUBJECTIVE:  PERTINENT PAST HISTORY:  Multiple past ACL tears        PRECAUTIONS: None  WEIGHT BEARING RESTRICTIONS No  FALLS:  Has patient fallen in last 6 months? No, Number of falls: 0  MOI/History of condition:  Onset date: 2/24  SUBJECTIVE STATEMENT  Pt reports that she has been trying to use a SPC to amulate but this is still challenging.  She reports  quite bit of swelling which she feels is limiting her flexion  EVAL: Brenda Mcneil is a 33 y.o. female who presents to clinic with chief complaint of L knee pain and stiffness following TKA on 2/24.  She has a history of ACL reconstruction, one in 2006 and one in 2010.  Both grafts failed which resulted in  arthritis, ultimately leading to L TKA on 2/24.   Red flags:  denies malaise, erythema / streaking, and chills / night sweats  Pain:  Are you having pain? 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,000 steps  Assistive Device: FWW, SPC, no AD at baseline  Hand Dominance: na  Patient Goals/Specific Activities: reduce pain, walking large amounts, lifting, stairs   OBJECTIVE:   DIAGNOSTIC FINDINGS:  na  GENERAL OBSERVATION/GAIT: Slow antalgic gait using FWW  SENSATION: Light touch: Appears intact  PALPATION: Expected edema about L knee with mild lateral bruising at proximal tibia, dressing in place and clean  LE MMT:  MMT Right (Eval) Left (Eval)  Hip flexion (L2, L3) 4 3-*  Knee extension (L3) 4+ 3-*  Knee flexion 4+ 3-*  Hip abduction  Hip extension    Hip external rotation    Hip internal rotation    Hip adduction    Ankle dorsiflexion (L4)    Ankle plantarflexion (S1)    Ankle inversion    Ankle eversion    Great Toe ext (L5)    Grossly     (Blank rows = not tested, score listed is out of 5 possible points.  N = WNL, D = diminished, C = clear for gross weakness with myotome testing, * = concordant pain with testing)  LE ROM:  ROM Right (Eval) Left (Eval) Left 3/4  Hip flexion     Hip extension     Hip abduction     Hip adduction     Hip internal rotation     Hip external rotation     Knee extension -4 Lacking 4 Lacking 2   Knee flexion 130 20 degrees 40 degrees passive  Ankle dorsiflexion     Ankle plantarflexion     Ankle inversion     Ankle eversion      (Blank rows = not tested, N = WNL, * = concordant pain with testing)  Functional Tests  Eval                                                              PATIENT SURVEYS:  LEFS: 13/80   TODAY'S TREATMENT: [facility] Adult PT Treatment  04/03/2023:  Therapeutic Exercise:  Patellar glides all directions Quad set with heel prop nu-step L2 78m for gentle ROM Heel slide with strap - 20x SLR with strap and PT assist - 2x5 SAQ - unable to activate quad  Therapeutic Activity  Pt education on importance of reducing swelling and increasing quad activation    HOME EXERCISE PROGRAM: Access Code: [email or web address] URL: [email or web address] Date: 04/03/2023 Prepared by: [name]  Exercises - Supine Ankle Pumps  - 8 x daily - 7 x weekly - 2 sets - 10 reps - Supine Heel Slide with Strap  - 2-3 x daily - 7 x weekly - 3 sets - 10 reps - Seated Hamstring Stretch  - 2-3 x daily - 7 x weekly - 1 sets - 3 reps - 45 hold - Seated Knee Flexion Extension AAROM with Overpressure  - 2-3 x daily - 7 x weekly - 3 sets - 10 reps - [place] Sitting 4 Way Patellar Glide  - 4 x daily - 7 x weekly - 3 sets - 10 reps - Supine Quad Set  - 5 x daily - 7 x weekly - 2 sets - 10 reps - 10 hold - Small Range Straight Leg Raise  - 1 x daily - 7 x weekly - 3 sets - 10 reps - Ice  - 5 x daily - 7 x weekly - 1 sets - 1 reps - 20 min hold  Treatment priorities   Eval                                                  ASSESSMENT:  CLINICAL IMPRESSION:  04/03/2023: Pt reports that she has been trying to use a SPC based on the advise of  some family members.  We discussed that at this point she is not quite ready to make this transition given her high levels of pain and lack of quad activation.  Her flexion ROM is still quite limited but improving.  Her  ext ROM is excellent.  Very minimal quad activation at this point.  Discussed the need to focus on reducing swelling and quad activation.  EVAL: Brenda Mcneil is a 33 y.o. female who presents to clinic with signs and sxs consistent with expected L knee pain and stiffness following TKA on 2/24.  Her ext ROM looks very good.  She is quite limited in her flexion at  this point.  Pt will benefit from skilled therapy to address relevant deficits and return to active job.    OBJECTIVE IMPAIRMENTS: Pain, knee ROM, knee strength, gait, balance  ACTIVITY LIMITATIONS: walking, steps, squatting, bending, lifting, working  PERSONAL FACTORS: See medical history and pertinent history   REHAB POTENTIAL: Good  CLINICAL DECISION MAKING: Stable/uncomplicated  EVALUATION COMPLEXITY: Low   GOALS:   SHORT TERM GOALS: Target date: 04/26/2023   Brenda Mcneil will be >75% HEP compliant to improve carryover between sessions and facilitate independent management of condition  Evaluation: ongoing Goal status: INITIAL   [place] TERM GOALS: Target date: 06/21/2023   Brenda Mcneil will self report </= 3/10 pain during daily activities from evaluation to improve function in daily tasks  Evaluation/Baseline: 7/10 max pain Goal status: INITIAL   2.  Brenda Mcneil will improve 10 meter max gait speed to 1.4 m/s (.1 m/s MCID) to show functional improvement in ambulation   Evaluation/Baseline: not tested Goal status: INITIAL   Norms:     3.  Brenda Mcneil will improve 30'' STS (MCID 2) to >/= 12x (w/ UE?: N) to show improved LE strength and improved transfers   Evaluation/Baseline: not tested Goal status: INITIAL   4.  Brenda Mcneil will show a >/= 36 pt improvement in LEFS score (MCID is ~11% or 9 pts) as a proxy for functional improvement   Evaluation/Baseline: 13 pts Goal status: INITIAL   5.  Brenda Mcneil will achieve 115 degrees knee flexion to improve ability to complete transfers, squat, and navigate  steps  Evaluation/Baseline: 20 degrees Goal status: INITIAL   6.  Brenda Mcneil will achieve 0 degrees knee extension to improve mechanics of gait  Evaluation/Baseline: lacking 4 degrees Goal status: INITIAL  7. Brenda Mcneil will be able to return to work, not limited by pain  Evaluation/Baseline: limited Goal status: INITIAL    PLAN: PT FREQUENCY: 1-2x/week  PT DURATION: 8 weeks  PLANNED INTERVENTIONS:  97164- PT Re-evaluation, 97110-Therapeutic exercises, 97530- Therapeutic activity, [id number]- Neuromuscular re-education, 97535- Self Care, 65784- Manual therapy, [id number]- Gait training, [id number]- Aquatic Therapy, [phone]- Electrical stimulation (manual), [id number]- Vasopneumatic device, [id number]- Traction (mechanical), [id number]- Ionotophoresis 4mg /ml Dexamethasone, Taping, Dry Needling, Joint manipulation, and Spinal manipulation.   [name] PT, DPT 04/03/2023, 10:08 AM  I just finished a MCD eval/recert.  Name: Brenda Mcneil  MRN: [id number] Please request 2x/week for 8 weeks.  Check all conditions that are expected to impact treatment: Musculoskeletal disorders   Check all possible CPT codes: 01027- Therapeutic Exercise, [phone]- Neuro Re-education, [phone] - Gait Training, [phone] - Manual Therapy, 97530 - Therapeutic Activities, 97535 - Self Care, [phone] - Re-evaluation, [id number] - Mechanical traction, and [phone] - Aquatic therapy   Thank you!  MCD - Secure

## 2023-04-05 ENCOUNTER — Ambulatory Visit: Payer: Medicaid Other

## 2023-04-10 ENCOUNTER — Ambulatory Visit: Payer: Medicaid Other

## 2023-04-10 DIAGNOSIS — R600 Localized edema: Secondary | ICD-10-CM

## 2023-04-10 DIAGNOSIS — M25562 Pain in left knee: Secondary | ICD-10-CM

## 2023-04-10 DIAGNOSIS — M6281 Muscle weakness (generalized): Secondary | ICD-10-CM

## 2023-04-10 DIAGNOSIS — R2681 Unsteadiness on feet: Secondary | ICD-10-CM

## 2023-04-10 NOTE — Therapy (Signed)
 OUTPATIENT PHYSICAL THERAPY DAILY NOTE  Patient Name: Brenda Mcneil MRN: [id number] DOB:Jul 29, 1990, 33 y.o., female Today's Date: 04/10/2023   PT End of Session - 04/10/23 0823     Visit Number 3    Date for PT Re-Evaluation 06/21/23    Authorization Type  MCD    Authorization Time Period submitted for initial 3 visits    PT Start Time 0830    PT Stop Time 0910    PT Time Calculation (min) 40 min    Activity Tolerance Patient tolerated treatment well    Behavior During Therapy [facility] for tasks assessed/performed              Past Medical History:  Diagnosis Date   Anemia    Biological false positive RPR test 03/02/2020   RPR reactive on 28 week labs on 02/24/20. With  Negative Tpal and titer 1:1, this is most likely false positive.  Repeat RPR in third trimester or when admitted to L&D. RPR NR at 37weeks.   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/26/2023   Procedure: TOTAL KNEE ARTHROPLASTY;  Surgeon: Joen Laura, MD;  Location: [facility];  Service: Orthopedics;  Laterality: Left;   Patient Active Problem List   Diagnosis Date Noted   Postpartum care following vaginal delivery 05/26/2020   Contraception management 05/26/2020    PCP: Aura Camps, FNP  REFERRING PROVIDER: Joen Laura, MD  THERAPY DIAG:  Left knee pain, unspecified chronicity  Muscle weakness  Unsteadiness on feet  Localized edema  REFERRING DIAG: L TKA 2/24  Rationale for Evaluation and Treatment:  Rehabilitation  SUBJECTIVE:  PERTINENT PAST HISTORY:  Multiple past ACL tears        PRECAUTIONS: None  WEIGHT BEARING RESTRICTIONS No  FALLS:  Has patient fallen in last 6 months? No, Number of falls: 0  MOI/History of condition:  Onset date: 2/24  SUBJECTIVE  STATEMENT Patient reports that her foot has been more swollen and that she has pain in the foot, calf, and knee when trying to ambulate. She reports HEP compliance, but that SLR are still very difficult. She sees Careers adviser today for follow up.   EVAL: Brenda Mcneil is a 33 y.o. female who presents to clinic with chief complaint of L knee pain and stiffness following TKA on 2/24.  She has a history of ACL reconstruction, one in 2006 and one in 2010.  Both grafts failed which resulted in  arthritis, ultimately leading to L TKA on 2/24.   Red flags:  denies malaise, erythema / streaking, and chills / night sweats  Pain:  Are you having pain? 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,000 steps  Assistive Device: FWW, SPC, no AD at baseline  Hand Dominance: na  Patient Goals/Specific Activities: reduce pain, walking large amounts, lifting, stairs   OBJECTIVE:   DIAGNOSTIC FINDINGS:  na  GENERAL OBSERVATION/GAIT: Slow antalgic gait using FWW  SENSATION: Light touch: Appears intact  PALPATION: Expected edema about L knee with mild lateral bruising at proximal tibia, dressing in place and clean  LE MMT:  MMT Right (  Eval) Left (Eval)  Hip flexion (L2, L3) 4 3-*  Knee extension (L3) 4+ 3-*  Knee flexion 4+ 3-*  Hip abduction    Hip extension    Hip external rotation    Hip internal rotation    Hip adduction    Ankle dorsiflexion (L4)    Ankle plantarflexion (S1)    Ankle inversion    Ankle eversion    Great Toe ext (L5)    Grossly     (Blank rows = not tested, score listed is out of 5 possible points.  N = WNL, D = diminished, C = clear for gross weakness with myotome testing, * = concordant pain with testing)  LE ROM:  ROM Right (Eval) Left (Eval)  Left 3/4 Left 04/10/23  Hip flexion      Hip extension      Hip abduction      Hip adduction      Hip internal rotation      Hip external rotation      Knee extension -4 Lacking 4 Lacking 2   Knee flexion 130 20 degrees 40 degrees passive 42  Ankle dorsiflexion      Ankle plantarflexion      Ankle inversion      Ankle eversion       (Blank rows = not tested, N = WNL, * = concordant pain with testing)  Functional Tests  Eval                                                              PATIENT SURVEYS:  LEFS: 13/80   TODAY'S TREATMENT: [facility] Adult PT Treatment:                                                DATE: 04/10/23 Therapeutic Exercise: nu-step L2 106m for gentle ROM Heel slide with strap - 20x Therapeutic Activity: Pt education on importance of reducing swelling and increasing quad activation Modalities: [place] e-stim to [name], pt in [place] sitting, towel prop under heel; intensity to 50, 10/20, x 10 mins   [facility] Adult PT Treatment :  Therapeutic Exercise:  Patellar glides all directions Quad set with heel prop nu-step L2 28m for gentle ROM Heel slide with strap - 20x SLR with strap and PT assist - 2x5 SAQ - unable to activate quad  Therapeutic Activity  Pt education on importance of reducing swelling and increasing quad activation    HOME EXERCISE PROGRAM: Access Code: [email or web address] URL: [email or web address] Date: 04/03/2023 Prepared by: [name]  Exercises - Supine Ankle Pumps  - 8 x daily - 7 x weekly - 2 sets - 10 reps - Supine Heel Slide with Strap  - 2-3 x daily - 7 x weekly - 3 sets - 10 reps - Seated Hamstring Stretch  - 2-3 x daily - 7 x weekly - 1 sets - 3 reps - 45 hold - Seated Knee Flexion Extension AAROM with Overpressure  - 2-3 x daily - 7 x weekly - 3 sets - 10 reps - [place] Sitting 4 Way Patellar Glide  - 4 x daily - 7 x weekly - 3 sets -  10 reps - Supine Quad Set  - 5 x daily - 7 x weekly - 2 sets - 10  reps - 10 hold - Small Range Straight Leg Raise  - 1 x daily - 7 x weekly - 3 sets - 10 reps - Ice  - 5 x daily - 7 x weekly - 1 sets - 1 reps - 20 min hold  Treatment priorities   Eval                                                  ASSESSMENT:  CLINICAL IMPRESSION: Patient presents to PT reporting continued swelling in her knee and that she has also had more swelling in her foot and increased pain in her foot and calf when ambulating. Utilized [place] e-stim this session with some noticeable increase in quad contraction, though still minimal at this time. Her flexion ROM continues to improve. Patient was able to tolerate all prescribed exercises with no adverse effects. Patient continues to benefit from skilled PT services and should be progressed as able to improve functional independence.   EVAL: Annelise is a 33 y.o. female who presents to clinic with signs and sxs consistent with expected L knee pain and stiffness following TKA on 2/24.  Her ext ROM looks very good.  She is quite limited in her flexion at  this point.  Pt will benefit from skilled therapy to address relevant deficits and return to active job.    OBJECTIVE IMPAIRMENTS: Pain, knee ROM, knee strength, gait, balance  ACTIVITY LIMITATIONS: walking, steps, squatting, bending, lifting, working  PERSONAL FACTORS: See medical history and pertinent history   REHAB POTENTIAL: Good  CLINICAL DECISION MAKING: Stable/uncomplicated  EVALUATION COMPLEXITY: Low   GOALS:   SHORT TERM GOALS: Target date: 04/26/2023   Lina will be >75% HEP compliant to improve carryover between sessions and facilitate independent management of condition  Evaluation: ongoing Goal status: INITIAL   [place] TERM GOALS: Target date: 06/21/2023   Aizah will self report </= 3/10 pain during daily activities from evaluation to improve function in daily tasks  Evaluation/Baseline: 7/10 max pain Goal status: INITIAL   2.  Aishi  will improve 10 meter max gait speed to 1.4 m/s (.1 m/s MCID) to show functional improvement in ambulation   Evaluation/Baseline: not tested Goal status: INITIAL   Norms:     3.  Francoise will improve 30'' STS (MCID 2) to >/= 12x (w/ UE?: N) to show improved LE strength and improved transfers   Evaluation/Baseline: not tested Goal status: INITIAL   4.  Gladine will show a >/= 36 pt improvement in LEFS score (MCID is ~11% or 9 pts) as a proxy for functional improvement   Evaluation/Baseline: 13 pts Goal status: INITIAL   5.  Maelyn will achieve 115 degrees knee flexion to improve ability to complete transfers, squat, and navigate steps  Evaluation/Baseline: 20 degrees Goal status: INITIAL   6.  Trana will achieve 0 degrees knee extension to improve mechanics of gait  Evaluation/Baseline: lacking 4 degrees Goal status: INITIAL  7. Spencer will be able to return to work, not limited by pain  Evaluation/Baseline: limited Goal status: INITIAL    PLAN: PT FREQUENCY: 1-2x/week  PT DURATION: 8 weeks  PLANNED INTERVENTIONS:  97164- PT Re-evaluation, 97110-Therapeutic exercises, 97530- Therapeutic activity, [id number]- Neuromuscular re-education, 97535- Self Care, [id number]-  Manual therapy, [id number]- Gait training, [id number]- Aquatic Therapy, [id number]- Electrical stimulation (manual), [id number]- Vasopneumatic device, [id number]- Traction (mechanical), [id number]- Ionotophoresis 4mg /ml Dexamethasone, Taping, Dry Needling, Joint manipulation, and Spinal manipulation.   Berta Minor PTA  04/10/2023, 9:06 AM

## 2023-04-12 ENCOUNTER — Ambulatory Visit: Payer: Medicaid Other

## 2023-04-12 DIAGNOSIS — M25562 Pain in left knee: Secondary | ICD-10-CM | POA: Diagnosis not present

## 2023-04-12 DIAGNOSIS — M6281 Muscle weakness (generalized): Secondary | ICD-10-CM

## 2023-04-12 DIAGNOSIS — R2681 Unsteadiness on feet: Secondary | ICD-10-CM

## 2023-04-12 DIAGNOSIS — R600 Localized edema: Secondary | ICD-10-CM

## 2023-04-12 NOTE — Therapy (Addendum)
 OUTPATIENT PHYSICAL THERAPY DAILY NOTE  Patient Name: Brenda Mcneil MRN: [id number] DOB:October 28, 1990, 33 y.o., female Today's Date: 04/12/2023   PT End of Session - 04/12/23 0818     Visit Number 4    Date for PT Re-Evaluation 06/21/23    Authorization Type [place] MCD    Authorization Time Period submitted for initial 3 visits    PT Start Time 0830    PT Stop Time 0910    PT Time Calculation (min) 40 min    Activity Tolerance Patient tolerated treatment well    Behavior During Therapy [facility] for tasks assessed/performed            Past Medical History:  Diagnosis Date   Anemia    Biological false positive RPR test 03/02/2020   RPR reactive on 28 week labs on 02/24/20. With  Negative Tpal and titer 1:1, this is most likely false positive.  Repeat RPR in third trimester or when admitted to L&D. RPR NR at 37weeks.   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/26/2023   Procedure: TOTAL KNEE ARTHROPLASTY;  Surgeon: Joen Laura, MD;  Location: [facility];  Service: Orthopedics;  Laterality: Left;   Patient Active Problem List   Diagnosis Date Noted   Postpartum care following vaginal delivery 05/26/2020   Contraception management 05/26/2020    PCP: Aura Camps, FNP  REFERRING PROVIDER: Joen Laura, MD  THERAPY DIAG:  Left knee pain, unspecified chronicity  Muscle weakness  Unsteadiness on feet  Localized edema  REFERRING DIAG: L TKA 2/24  Rationale for Evaluation and Treatment:  Rehabilitation  SUBJECTIVE:  PERTINENT PAST HISTORY:  Multiple past ACL tears        PRECAUTIONS: None  WEIGHT BEARING RESTRICTIONS No  FALLS:  Has patient fallen in last 6 months? No, Number of falls: 0  MOI/History of condition:  Onset date: 2/24  SUBJECTIVE STATEMENT Patient  reports that she saw PA the other day and they are having her return next week (Tuesday after PT appt) to see MD regarding swelling and lack of flexion progress. She reports that she is still having a lot pain in her knee and in her foot.  EVAL: Brenda Mcneil is a 33 y.o. female who presents to clinic with chief complaint of L knee pain and stiffness following TKA on 2/24.  She has a history of ACL reconstruction, one in 2006 and one in 2010.  Both grafts failed which resulted in  arthritis, ultimately leading to L TKA on 2/24.   Red flags:  denies malaise, erythema / streaking, and chills / night sweats  Pain:  Are you having pain? 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,000 steps  Assistive Device: FWW, SPC, no AD at baseline  Hand Dominance: na  Patient Goals/Specific Activities: reduce pain, walking large amounts, lifting, stairs   OBJECTIVE:   DIAGNOSTIC FINDINGS:  na  GENERAL OBSERVATION/GAIT: Slow antalgic gait using FWW  SENSATION: Light touch: Appears intact  PALPATION: Expected edema about L knee with mild lateral bruising at proximal tibia, dressing in place and clean  LE MMT:  MMT Right (Eval) Left (Eval)  Hip flexion (L2, L3) 4 3-*  Knee extension (L3) 4+ 3-*  Knee flexion 4+ 3-*  Hip abduction    Hip extension    Hip external rotation    Hip internal rotation    Hip adduction    Ankle dorsiflexion (L4)    Ankle plantarflexion (S1)    Ankle inversion    Ankle eversion    Great Toe ext (L5)    Grossly     (Blank rows = not tested, score listed is out of 5 possible points.  N = WNL, D = diminished, C = clear for gross weakness with myotome testing, * = concordant pain with testing)  LE ROM:  ROM Right (Eval) Left (Eval)  Left 3/4 Left 04/10/23 Left 04/12/23  Hip flexion       Hip extension       Hip abduction       Hip adduction       Hip internal rotation       Hip external rotation       Knee extension -4 Lacking 4 Lacking 2    Knee flexion 130 20 degrees 40 degrees passive 42 45  Ankle dorsiflexion       Ankle plantarflexion       Ankle inversion       Ankle eversion        (Blank rows = not tested, N = WNL, * = concordant pain with testing)  Functional Tests  Eval                                                              PATIENT SURVEYS:  LEFS: 13/80   TODAY'S TREATMENT: [facility] Adult PT Treatment:                                                DATE: 04/12/23 Therapeutic Exercise: nu-step L2 23m for gentle ROM Heel slide with strap - 20x Patellar glides all directions SAQ - unable to activate quad - Therapist assist x10 PROM patient in supine  Modalities: [place] e-stim to [name], pt in [name] sitting, towel prop under heel; intensity to 42, 10/20, x 10 mins   [facility] Adult PT Treatment:                                                DATE: 04/10/23 Therapeutic Exercise: nu-step L2 62m for gentle ROM Heel slide with strap - 20x Therapeutic Activity: Pt education on importance of reducing swelling and increasing quad activation Modalities: [place] e-stim to [name], pt in [name] sitting, towel prop under heel; intensity to 50, 10/20, x 10 mins   [facility] Adult PT Treatment :  Therapeutic Exercise:  Patellar glides all directions Quad set with heel prop nu-step L2 39m for gentle ROM Heel slide with strap - 20x SLR with strap and PT assist - 2x5 SAQ - unable to activate quad  Therapeutic Activity  Pt education on importance of reducing swelling and increasing quad  activation    HOME EXERCISE PROGRAM: Access Code: [email or web address] URL: [email or web address] Date: 04/03/2023 Prepared by: [name]  Exercises - Supine Ankle Pumps  - 8 x daily - 7 x  weekly - 2 sets - 10 reps - Supine Heel Slide with Strap  - 2-3 x daily - 7 x weekly - 3 sets - 10 reps - Seated Hamstring Stretch  - 2-3 x daily - 7 x weekly - 1 sets - 3 reps - 45 hold - Seated Knee Flexion Extension AAROM with Overpressure  - 2-3 x daily - 7 x weekly - 3 sets - 10 reps - [place] Sitting 4 Way Patellar Glide  - 4 x daily - 7 x weekly - 3 sets - 10 reps - Supine Quad Set  - 5 x daily - 7 x weekly - 2 sets - 10 reps - 10 hold - Small Range Straight Leg Raise  - 1 x daily - 7 x weekly - 3 sets - 10 reps - Ice  - 5 x daily - 7 x weekly - 1 sets - 1 reps - 20 min hold  Treatment priorities   Eval                                                  ASSESSMENT:  CLINICAL IMPRESSION: Patient presents to PT reporting continued pain her Lt knee and foot and that she saw the PA for her surgeon the other day who is concerned about the swelling and lack of flexion progress so she is returning to se the surgeon next Tuesday. Session today continued to focus on improving quality of quad contraction with [place] e-stim and knee flexion AAROM and PROM. Flexion ROM at 45 today. Patient continues to benefit from skilled PT services and should be progressed as able to improve functional independence.   EVAL: Biana is a 33 y.o. female who presents to clinic with signs and sxs consistent with expected L knee pain and stiffness following TKA on 2/24.  Her ext ROM looks very good.  She is quite limited in her flexion at  this point.  Pt will benefit from skilled therapy to address relevant deficits and return to active job.    OBJECTIVE IMPAIRMENTS: Pain, knee ROM, knee strength, gait, balance  ACTIVITY LIMITATIONS: walking, steps, squatting, bending, lifting, working  PERSONAL FACTORS: See medical history and pertinent history   REHAB POTENTIAL: Good  CLINICAL DECISION MAKING: Stable/uncomplicated  EVALUATION COMPLEXITY: Low   GOALS:   SHORT TERM GOALS: Target date:  04/26/2023   [place] will be >75% HEP compliant to improve carryover between sessions and facilitate independent management of condition  Evaluation: ongoing Goal status: MET Pt reports adherence 04/12/23   [place] TERM GOALS: Target date: 06/21/2023   Tyliyah will self report </= 3/10 pain during daily activities from evaluation to improve function in daily tasks  Evaluation/Baseline: 7/10 max pain Goal status: Ongoing   2.  Rucha will improve 10 meter max gait speed to 1.4 m/s (.1 m/s MCID) to show functional improvement in ambulation   Evaluation/Baseline: not tested Goal status: Ongoing   Norms:     3.  Chanin will improve 30'' STS (MCID 2) to >/= 12x (w/ UE?: N) to show improved LE strength and improved transfers   Evaluation/Baseline: not tested Goal status: Ongoing   4.  Mariadelcarmen will  show a >/= 36 pt improvement in LEFS score (MCID is ~11% or 9 pts) as a proxy for functional improvement   Evaluation/Baseline: 13 pts Goal status: Ongoing   5.  Verina will achieve 115 degrees knee flexion to improve ability to complete transfers, squat, and navigate steps  Evaluation/Baseline: 20 degrees Goal status: Ongoing 04/12/23: 45 degrees - see above chart   6.  Neaveh will achieve 0 degrees knee extension to improve mechanics of gait  Evaluation/Baseline: lacking 4 degrees Goal status: Ongoing  7. Markala will be able to return to work, not limited by pain  Evaluation/Baseline: limited Goal status: Ongoing    PLAN: PT FREQUENCY: 1-2x/week  PT DURATION: 8 weeks  PLANNED INTERVENTIONS:  97164- PT Re-evaluation, 97110-Therapeutic exercises, 97530- Therapeutic activity, [id number]- Neuromuscular re-education, 97535- Self Care, 16109- Manual therapy, [id number]- Gait training, [id number]- Aquatic Therapy, [phone]- Electrical stimulation (manual), [id number]- Vasopneumatic device, [id number]- Traction (mechanical), [id number]- Ionotophoresis 4mg /ml Dexamethasone, Taping, Dry Needling, Joint  manipulation, and Spinal manipulation.   Berta Minor PTA  04/12/2023, 9:11 AM

## 2023-04-17 ENCOUNTER — Ambulatory Visit: Payer: Medicaid Other | Admitting: Physical Therapy

## 2023-04-17 ENCOUNTER — Encounter: Payer: Self-pay | Admitting: Physical Therapy

## 2023-04-17 DIAGNOSIS — M6281 Muscle weakness (generalized): Secondary | ICD-10-CM

## 2023-04-17 DIAGNOSIS — R600 Localized edema: Secondary | ICD-10-CM

## 2023-04-17 DIAGNOSIS — M25562 Pain in left knee: Secondary | ICD-10-CM | POA: Diagnosis not present

## 2023-04-17 DIAGNOSIS — R2681 Unsteadiness on feet: Secondary | ICD-10-CM

## 2023-04-17 NOTE — Therapy (Signed)
 OUTPATIENT PHYSICAL THERAPY DAILY NOTE  Patient Name: Brenda Mcneil MRN: [id number] DOB:1990/03/28, 33 y.o., female Today's Date: 04/17/2023   PT End of Session - 04/17/23 0830     Visit Number 5    Date for PT Re-Evaluation 06/21/23    Authorization Type [place] [facility]    Authorization Time Period submitted for initial 3 visits    PT Start Time 0830    PT Stop Time 0912    PT Time Calculation (min) 42 min    Activity Tolerance Patient tolerated treatment well    Behavior During Therapy [facility]da [employer] for tasks assessed/performed            Past Medical History:  Diagnosis Date   Anemia    Biological false positive RPR test 03/02/2020   RPR reactive on 28 week labs on 02/24/20. With  Negative Tpal and titer 1:1, this is most likely false positive.  Repeat RPR in third trimester or when admitted to L&D. RPR NR at 37weeks.   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/26/2023   Procedure: TOTAL KNEE ARTHROPLASTY;  Surgeon: Brenda Laura, MD;  Location: [facility];  Service: Orthopedics;  Laterality: Left;   Patient Active Problem List   Diagnosis Date Noted   Postpartum care following vaginal delivery 05/26/2020   Contraception management 05/26/2020    PCP: Brenda Camps, FNP  REFERRING PROVIDER: Joen Laura, MD  THERAPY DIAG:  Left knee pain, unspecified chronicity  Muscle weakness  Unsteadiness on feet  Localized edema  REFERRING DIAG: L TKA 2/24  Rationale for Evaluation and Treatment:  Rehabilitation  SUBJECTIVE:  PERTINENT PAST HISTORY:  Multiple past ACL tears        PRECAUTIONS: None  WEIGHT BEARING RESTRICTIONS No  FALLS:  Has patient fallen in last 6 months? No, Number of falls: 0  MOI/History of condition:  Onset date: 2/24  SUBJECTIVE STATEMENT Pt  reports she follows up with MD today.  She feels she has made slow progress with flexion.  EVAL: Brenda Mcneil is a 33 y.o. female who presents to clinic with chief complaint of L knee pain and stiffness following TKA on 2/24.  She has a history of ACL reconstruction, one in 2006 and one in 2010.  Both grafts failed which resulted in  arthritis, ultimately leading to L TKA on 2/24.   Red flags:  denies malaise, erythema / streaking, and chills / night sweats  Pain:  Are you having pain? 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,000 steps  Assistive Device: FWW, SPC, no AD at baseline  Hand Dominance: na  Patient Goals/Specific Activities: reduce pain, walking large amounts, lifting, stairs   OBJECTIVE:   DIAGNOSTIC FINDINGS:  na  GENERAL OBSERVATION/GAIT: Slow antalgic gait using FWW  SENSATION: Light touch: Appears intact  PALPATION: Expected edema about L knee with mild lateral bruising at proximal tibia, dressing in place and clean  LE MMT:  MMT Right (Eval) Left (Eval)  Hip flexion (L2, L3) 4 3-*  Knee extension (L3) 4+ 3-*  Knee flexion 4+ 3-*  Hip abduction  Hip extension    Hip external rotation    Hip internal rotation    Hip adduction    Ankle dorsiflexion (L4)    Ankle plantarflexion (S1)    Ankle inversion    Ankle eversion    Great Toe ext (L5)    Grossly     (Blank rows = not tested, score listed is out of 5 possible points.  N = WNL, D = diminished, C = clear for gross weakness with myotome testing, * = concordant pain with testing)  LE ROM:  ROM Right (Eval) Left (Eval) Left 3/4 Left 04/10/23 Left 04/12/23 L 3/18  Hip flexion        Hip extension        Hip abduction        Hip adduction        Hip internal  rotation        Hip external rotation        Knee extension -4 Lacking 4 Lacking 2     Knee flexion 130 20 degrees 40 degrees passive 42 45 50 prior to manual 65 with pain and OP following manual  Ankle dorsiflexion        Ankle plantarflexion        Ankle inversion        Ankle eversion         (Blank rows = not tested, N = WNL, * = concordant pain with testing)  Functional Tests  Eval                                                              PATIENT SURVEYS:  LEFS: 13/80   TODAY'S TREATMENT: [facility] Adult PT Treatment:                                                DATE: 04/17/23 Therapeutic Exercise: nu-step L2 81m for gentle ROM Heel slide with strap - 20x SAQ - unable to activate quad - Therapist assist x10  Neuromuscular re-ed: [place] e-stim to [employer], with towel under knee; intensity to 50, 10/10, x 10 mins  Manual Therapy AP/PA mobs with distraction - patient edge of table Tailgate stretch with pin and stretch of quad Knee flexion stretch with bolster under knee, increasing height as able   [facility] Adult PT Treatment:                                                DATE: 04/10/23 Therapeutic Exercise: nu-step L2 74m for gentle ROM Heel slide with strap - 20x Therapeutic Activity: Pt education on importance of reducing swelling and increasing quad activation Modalities: [place] e-stim to [employer]da [employer]il, pt in [place] sitting, towel prop under heel; intensity to 50, 10/20, x 10 mins   [facility] Adult PT Treatment :  Therapeutic Exercise:  Patellar glides all directions Quad set with heel prop nu-step L2 30m for gentle ROM Heel slide with strap - 20x SLR with strap and PT assist - 2x5 SAQ - unable to activate  quad  Therapeutic Activity  Pt education on importance of reducing swelling and increasing quad activation    HOME EXERCISE PROGRAM: Access Code: [email or web address] URL: [email or web address] Date: 04/03/2023 Prepared by: Brenda Mcneil  Exercises - Supine Ankle Pumps  - 8 x daily - 7 x weekly - 2 sets - 10 reps - Supine Heel Slide with Strap  - 2-3 x daily - 7 x weekly - 3 sets - 10 reps - Seated Hamstring Stretch  - 2-3 x daily - 7 x weekly - 1 sets - 3 reps - 45 hold - Seated Knee Flexion Extension AAROM with Overpressure  - 2-3 x daily - 7 x weekly - 3 sets - 10 reps - [place] Sitting 4 Way Patellar Glide  - 4 x daily - 7 x weekly - 3 sets - 10 reps - Supine Quad Set  - 5 x daily - 7 x weekly - 2 sets - 10 reps - 10 hold - Small Range Straight Leg Raise  - 1 x daily - 7 x weekly - 3 sets - 10 reps - Ice  - 5 x daily - 7 x weekly - 1 sets - 1 reps - 20 min hold  Treatment priorities   Eval                                                  ASSESSMENT:  CLINICAL IMPRESSION: Lovenia tolerated session well with no adverse reaction.  Pt was fitted for compression garment since last visit which has reduced swelling significantly.  We were able to get to 65 degrees of flexion which is a significant improvement since last visit but still considerably behind expectations.  She also continues to have very weak quad activation even with estim.  I suggested she get a home estim unit to work on this at home.  She has follow up with surgeon today to discuss progress.    EVAL: Kailiana is a 33 y.o. female who presents to clinic with signs and sxs consistent with expected L knee pain and stiffness following TKA on 2/24.  Her ext ROM looks very good.  She is quite limited in her flexion at  this point.  Pt will benefit from skilled therapy to address relevant deficits and return to active job.    OBJECTIVE IMPAIRMENTS: Pain, knee ROM, knee strength, gait, balance  ACTIVITY LIMITATIONS: walking, steps, squatting, bending, lifting, working  PERSONAL FACTORS: See medical history and pertinent history   REHAB POTENTIAL: Good  CLINICAL DECISION MAKING: Stable/uncomplicated  EVALUATION COMPLEXITY:  Low   GOALS:   SHORT TERM GOALS: Target date: 04/26/2023   Milanni will be >75% HEP compliant to improve carryover between sessions and facilitate independent management of condition  Evaluation: ongoing Goal status: MET Pt reports adherence 04/12/23   Gene TERM GOALS: Target date: 06/21/2023   Sheril will self report </= 3/10 pain during daily activities from evaluation to improve function in daily tasks  Evaluation/Baseline: 7/10 max pain Goal status: Ongoing   2.  Vallarie will improve 10 meter max gait speed to 1.4 m/s (.1 m/s MCID) to show functional improvement in ambulation   Evaluation/Baseline: not tested Goal status: Ongoing   Norms:     3.  Kayona will improve 30'' STS (MCID 2) to >/= 12x (w/ UE?: N) to show improved LE strength and improved transfers  Evaluation/Baseline: not tested Goal status: Ongoing   4.  Lajean will show a >/= 36 pt improvement in LEFS score (MCID is ~11% or 9 pts) as a proxy for functional improvement   Evaluation/Baseline: 13 pts Goal status: Ongoing   5.  Avannah will achieve 115 degrees knee flexion to improve ability to complete transfers, squat, and navigate steps  Evaluation/Baseline: 20 degrees Goal status: Ongoing 04/12/23: 45 degrees - see above chart   6.  Kyira will achieve 0 degrees knee extension to improve mechanics of gait  Evaluation/Baseline: lacking 4 degrees Goal status: Ongoing  7. Melek will be able to return to work, not limited by pain  Evaluation/Baseline: limited Goal status: Ongoing    PLAN: PT FREQUENCY: 1-2x/week  PT DURATION: 8 weeks  PLANNED INTERVENTIONS:  97164- PT Re-evaluation, 97110-Therapeutic exercises, 97530- Therapeutic activity, [id number]- Neuromuscular re-education, 97535- Self Care, [id number]- Manual therapy, [id number]- Gait training, [id number]- Aquatic Therapy, [phone]- Electrical stimulation (manual), [id number]- Vasopneumatic device, [id number]- Traction (mechanical), [id number]- Ionotophoresis  4mg /ml Dexamethasone, Taping, Dry Needling, Joint manipulation, and Spinal manipulation.   Kimberlee Nearing Tevis Dunavan PT  04/17/2023, 9:33 AM

## 2023-04-19 ENCOUNTER — Ambulatory Visit: Payer: Medicaid Other | Admitting: Physical Therapy

## 2023-04-19 ENCOUNTER — Encounter: Payer: Self-pay | Admitting: Physical Therapy

## 2023-04-19 DIAGNOSIS — M25562 Pain in left knee: Secondary | ICD-10-CM

## 2023-04-19 DIAGNOSIS — M6281 Muscle weakness (generalized): Secondary | ICD-10-CM

## 2023-04-19 DIAGNOSIS — R2681 Unsteadiness on feet: Secondary | ICD-10-CM

## 2023-04-19 NOTE — Therapy (Signed)
 OUTPATIENT PHYSICAL THERAPY DAILY NOTE  Patient Name: Brenda Mcneil MRN: [id number] DOB:Jun 14, 1990, 33 y.o., female Today's Date: 04/19/2023   PT End of Session - 04/19/23 0907     Visit Number 6    Date for PT Re-Evaluation 06/21/23    Authorization Type [place] [facility]    Authorization - Number of Visits 12    PT Start Time 0830    PT Stop Time 0910    PT Time Calculation (min) 40 min             Past Medical History:  Diagnosis Date   Anemia    Biological false positive RPR test 03/02/2020   RPR reactive on 28 week labs on 02/24/20. With  Negative Tpal and titer 1:1, this is most likely false positive.  Repeat RPR in third trimester or when admitted to L&D. RPR NR at 37weeks.   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/26/2023   Procedure: TOTAL KNEE ARTHROPLASTY;  Surgeon: Joen Laura, MD;  Location: [facility];  Service: Orthopedics;  Laterality: Left;   Patient Active Problem List   Diagnosis Date Noted   Postpartum care following vaginal delivery 05/26/2020   Contraception management 05/26/2020    PCP: Aura Camps, FNP  REFERRING PROVIDER: Joen Laura, MD  THERAPY DIAG:  Left knee pain, unspecified chronicity  Muscle weakness  Unsteadiness on feet  REFERRING DIAG: L TKA 2/24  Rationale for Evaluation and Treatment:  Rehabilitation  SUBJECTIVE:  PERTINENT PAST HISTORY:  Multiple past ACL tears        PRECAUTIONS: None  WEIGHT BEARING RESTRICTIONS No  FALLS:  Has patient fallen in last 6 months? No, Number of falls: 0  MOI/History of condition:  Onset date: 2/24  SUBJECTIVE STATEMENT Stated knee is feeling pretty decent today, continues to use compression garments and ice for swelling management. Going to pick up personal NMES machine toda for  at home use.  EVAL: Brenda Mcneil is a 33 y.o. female who presents to clinic with chief complaint of L knee pain and stiffness following TKA on 2/24.  She has a history of ACL reconstruction, one in 2006 and one in 2010.  Both grafts failed which resulted in  arthritis, ultimately leading to L TKA on 2/24.   Red flags:  denies malaise, erythema / streaking, and chills / night sweats  Pain:  Are you having pain? 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,000 steps  Assistive Device: FWW, SPC, no AD at baseline  Hand Dominance: na  Patient Goals/Specific Activities: reduce pain, walking large amounts, lifting, stairs   OBJECTIVE:   DIAGNOSTIC FINDINGS:  na  GENERAL OBSERVATION/GAIT: Slow antalgic gait using FWW  SENSATION: Light touch: Appears intact  PALPATION: Expected edema about L knee with mild lateral bruising at proximal tibia, dressing in place and clean  LE MMT:  MMT Right (Eval) Left (Eval)  Hip flexion (L2, L3) 4 3-*  Knee extension (L3) 4+ 3-*  Knee flexion 4+ 3-*  Hip abduction    Hip extension    Hip external rotation    Hip  internal rotation    Hip adduction    Ankle dorsiflexion (L4)    Ankle plantarflexion (S1)    Ankle inversion    Ankle eversion    Great Toe ext (L5)    Grossly     (Blank rows = not tested, score listed is out of 5 possible points.  N = WNL, D = diminished, C = clear for gross weakness with myotome testing, * = concordant pain with testing)  LE ROM:  ROM Right (Eval) Left (Eval) Left 3/4 Left 04/10/23 Left 04/12/23 L 3/18  Hip flexion        Hip extension        Hip abduction        Hip adduction        Hip internal rotation        Hip external rotation        Knee extension -4 Lacking 4 Lacking  2     Knee flexion 130 20 degrees 40 degrees passive 42 45 50 prior to manual 65 with pain and OP following manual  Ankle dorsiflexion        Ankle plantarflexion        Ankle inversion        Ankle eversion         (Blank rows = not tested, N = WNL, * = concordant pain with testing)  Functional Tests  Eval                                                              PATIENT SURVEYS:  LEFS: 13/80   TODAY'S TREATMENT: [facility] Adult PT Treatment:                                                DATE: 04/19/2023  Therapeutic Exercise: NuStep 5' PROM w/distraction Flex/ext Neuromuscular re-ed: [place] e-stim to Lt quads 10/10, intensity: 24, palpable contraction  SAQ, leg lifts, 6' each   [facility] Adult PT Treatment:                                                DATE: 04/17/23 Therapeutic Exercise: nu-step L2 68m for gentle ROM Heel slide with strap - 20x SAQ - unable to activate quad - Therapist assist x10  Neuromuscular re-ed: [place] e-stim to [name], with towel under knee; intensity to 50, 10/10, x 10 mins  Manual Therapy AP/PA mobs with distraction - patient edge of table Tailgate stretch with pin and stretch of quad Knee flexion stretch with bolster under knee, increasing height as able     HOME EXERCISE PROGRAM: Access Code: [email or web address] URL: [email or web address] Date: 04/03/2023 Prepared by: [name]  Exercises - Supine Ankle Pumps  - 8 x daily - 7 x weekly - 2 sets - 10 reps - Supine Heel Slide with Strap  - 2-3 x daily - 7 x weekly - 3 sets - 10 reps - Seated Hamstring Stretch  - 2-3 x daily - 7 x weekly - 1 sets - 3 reps -  45 hold - Seated Knee Flexion Extension AAROM with Overpressure  - 2-3 x daily - 7 x weekly - 3 sets - 10 reps - [place] Sitting 4 Way Patellar Glide  - 4 x daily - 7 x weekly - 3 sets - 10 reps - Supine Quad Set  - 5 x daily - 7 x weekly - 2 sets - 10 reps - 10 hold - Small Range Straight Leg Raise  - 1 x  daily - 7 x weekly - 3 sets - 10 reps - Ice  - 5 x daily - 7 x weekly - 1 sets - 1 reps - 20 min hold  Treatment priorities   Eval                                                  ASSESSMENT:  CLINICAL IMPRESSION: Pt attended physical therapy session for continuation of treatment regarding L TKA. Today's treatment focused on improvement of  quadriceps recruitment and L knee P/AROM. Pt showed  great tolerance to treatment and demonstrated improvement with quadriceps recruitment, performing x5 SLR with r[place] NMES in a 10s interval, <5d extensor lag. Knee PROM was measured at 0-65, AROM 0-52. Pt required moderate verbal/tactile cuing alongside minimal physical assistance for safe and appropriate performance of today's activities. Continue with therapeutic focus on quadriceps recruitment, WB tolerance, L knee A/PROM, and swelling management.  EVAL: Charisse is a 33 y.o. female who presents to clinic with signs and sxs consistent with expected L knee pain and stiffness following TKA on 2/24.  Her ext ROM looks very good.  She is quite limited in her flexion at  this point.  Pt will benefit from skilled therapy to address relevant deficits and return to active job.    OBJECTIVE IMPAIRMENTS: Pain, knee ROM, knee strength, gait, balance  ACTIVITY LIMITATIONS: walking, steps, squatting, bending, lifting, working  PERSONAL FACTORS: See medical history and pertinent history   REHAB POTENTIAL: Good  CLINICAL DECISION MAKING: Stable/uncomplicated  EVALUATION COMPLEXITY: Low   GOALS:   SHORT TERM GOALS: Target date: 04/26/2023   Vernesha will be >75% HEP compliant to improve carryover between sessions and facilitate independent management of condition  Evaluation: ongoing Goal status: MET Pt reports adherence 04/12/23   [place] TERM GOALS: Target date: 06/21/2023   Cristi will self report </= 3/10 pain during daily activities from evaluation to improve function in daily  tasks  Evaluation/Baseline: 7/10 max pain Goal status: Ongoing   2.  Kanisha will improve 10 meter max gait speed to 1.4 m/s (.1 m/s MCID) to show functional improvement in ambulation   Evaluation/Baseline: not tested Goal status: Ongoing   Norms:     3.  Finleigh will improve 30'' STS (MCID 2) to >/= 12x (w/ UE?: N) to show improved LE strength and improved transfers   Evaluation/Baseline: not tested Goal status: Ongoing   4.  Radiah will show a >/= 36 pt improvement in LEFS score (MCID is ~11% or 9 pts) as a proxy for functional improvement   Evaluation/Baseline: 13 pts Goal status: Ongoing   5.  Dorothea will achieve 115 degrees knee flexion to improve ability to complete transfers, squat, and navigate steps  Evaluation/Baseline: 20 degrees Goal status: Ongoing 04/12/23: 45 degrees - see above chart   6.  Zakiyah will achieve 0 degrees knee extension to improve mechanics  of gait  Evaluation/Baseline: lacking 4 degrees Goal status: Ongoing  7. Keosha will be able to return to work, not limited by pain  Evaluation/Baseline: limited Goal status: Ongoing    PLAN: PT FREQUENCY: 1-2x/week  PT DURATION: 8 weeks  PLANNED INTERVENTIONS:  97164- PT Re-evaluation, 97110-Therapeutic exercises, 97530- Therapeutic activity, [id number]- Neuromuscular re-education, 97535- Self Care, [id number]- Manual therapy, [id number]- Gait training, [id number]- Aquatic Therapy, [phone]- Electrical stimulation (manual), [id number]- Vasopneumatic device, [id number]- Traction (mechanical), [id number]- Ionotophoresis 4mg /ml Dexamethasone, Taping, Dry Needling, Joint manipulation, and Spinal manipulation.   Sheliah Plane, PT, DPT 04/19/2023, 9:13 AM

## 2023-04-24 ENCOUNTER — Encounter: Payer: Self-pay | Admitting: Physical Therapy

## 2023-04-24 ENCOUNTER — Ambulatory Visit: Payer: Self-pay | Admitting: Physical Therapy

## 2023-04-24 DIAGNOSIS — R2681 Unsteadiness on feet: Secondary | ICD-10-CM

## 2023-04-24 DIAGNOSIS — M25562 Pain in left knee: Secondary | ICD-10-CM | POA: Diagnosis not present

## 2023-04-24 DIAGNOSIS — M6281 Muscle weakness (generalized): Secondary | ICD-10-CM

## 2023-04-24 DIAGNOSIS — R600 Localized edema: Secondary | ICD-10-CM

## 2023-04-24 NOTE — Therapy (Signed)
 OUTPATIENT PHYSICAL THERAPY DAILY NOTE  Patient Name: Brenda Mcneil MRN: [id number] DOB:1990-08-19, 33 y.o., female Today's Date: 04/24/2023   PT End of Session - 04/24/23 0827     Visit Number 7    Date for PT Re-Evaluation 06/21/23    Authorization Type [place] [facility]    Authorization - Number of Visits 12    PT Start Time 0830    PT Stop Time 0911    PT Time Calculation (min) 41 min             Past Medical History:  Diagnosis Date   Anemia    Biological false positive RPR test 03/02/2020   RPR reactive on 28 week labs on 02/24/20. With  Negative Tpal and titer 1:1, this is most likely false positive.  Repeat RPR in third trimester or when admitted to L&D. RPR NR at 37weeks.   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/26/2023   Procedure: TOTAL KNEE ARTHROPLASTY;  Surgeon: Joen Laura, MD;  Location: [facility];  Service: Orthopedics;  Laterality: Left;   Patient Active Problem List   Diagnosis Date Noted   Postpartum care following vaginal delivery 05/26/2020   Contraception management 05/26/2020    PCP: Aura Camps, FNP  REFERRING PROVIDER: Joen Laura, MD  THERAPY DIAG:  Left knee pain, unspecified chronicity  Muscle weakness  Unsteadiness on feet  Localized edema  REFERRING DIAG: L TKA 2/24  Rationale for Evaluation and Treatment:  Rehabilitation  SUBJECTIVE:  PERTINENT PAST HISTORY:  Multiple past ACL tears        PRECAUTIONS: None  WEIGHT BEARING RESTRICTIONS No  FALLS:  Has patient fallen in last 6 months? No, Number of falls: 0  MOI/History of condition:  Onset date: 2/24  SUBJECTIVE STATEMENT Pt reports that she got a home estim unit and has used it a few times.  EVAL: Brenda Mcneil is a 33 y.o. female who presents to clinic with  chief complaint of L knee pain and stiffness following TKA on 2/24.  She has a history of ACL reconstruction, one in 2006 and one in 2010.  Both grafts failed which resulted in  arthritis, ultimately leading to L TKA on 2/24.   Red flags:  denies malaise, erythema / streaking, and chills / night sweats  Pain:  Are you having pain? 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,000 steps  Assistive Device: FWW, SPC, no AD at baseline  Hand Dominance: na  Patient Goals/Specific Activities: reduce pain, walking large amounts, lifting, stairs   OBJECTIVE:   DIAGNOSTIC FINDINGS:  na  GENERAL OBSERVATION/GAIT: Slow antalgic gait using FWW  SENSATION: Light touch: Appears intact  PALPATION: Expected edema about L knee with mild lateral bruising at proximal tibia, dressing in place and clean  LE MMT:  MMT Right (Eval) Left (Eval)  Hip flexion (L2, L3) 4 3-*  Knee extension (L3) 4+ 3-*  Knee flexion 4+ 3-*  Hip abduction    Hip extension    Hip external rotation    Hip internal rotation    Hip adduction  Ankle dorsiflexion (L4)    Ankle plantarflexion (S1)    Ankle inversion    Ankle eversion    Great Toe ext (L5)    Grossly     (Blank rows = not tested, score listed is out of 5 possible points.  N = WNL, D = diminished, C = clear for gross weakness with myotome testing, * = concordant pain with testing)  LE ROM:  ROM Right (Eval) Left (Eval) Left 3/4 Left 04/10/23 Left 04/12/23 L 3/18 L 3/25  Hip flexion         Hip extension         Hip abduction         Hip adduction         Hip internal rotation         Hip external rotation         Knee extension -4 Lacking 4 Lacking 2      Knee flexion 130 20 degrees 40 degrees passive 42 45 50  prior to manual 65 with pain and OP following manual 75 with OP  Ankle dorsiflexion         Ankle plantarflexion         Ankle inversion         Ankle eversion          (Blank rows = not tested, N = WNL, * = concordant pain with testing)  Functional Tests  Eval                                                              PATIENT SURVEYS:  LEFS: 13/80   TODAY'S TREATMENT:   [facility] Adult PT Treatment:                                                DATE: 04/24/23 Therapeutic Exercise: nu-step L2 74m for gentle ROM Heel slide with strap - 20x  Neuromuscular re-ed: [place] e-stim to [name], with towel under knee; intensity to 50, 10/10, x 15 mins - Small range SLR  Manual Therapy AP/PA mobs with distraction - patient edge of table Tailgate stretch with pin and stretch of quad Knee flexion stretch with bolster under knee, increasing height as able     HOME EXERCISE PROGRAM: Access Code: [email or web address] URL: [email or web address] Date: 04/03/2023 Prepared by: [name]  Exercises - Supine Ankle Pumps  - 8 x daily - 7 x weekly - 2 sets - 10 reps - Supine Heel Slide with Strap  - 2-3 x daily - 7 x weekly - 3 sets - 10 reps - Seated Hamstring Stretch  - 2-3 x daily - 7 x weekly - 1 sets - 3 reps - 45 hold - Seated Knee Flexion Extension AAROM with Overpressure  - 2-3 x daily - 7 x weekly - 3 sets - 10 reps - [place] Sitting 4 Way Patellar Glide  - 4 x daily - 7 x weekly - 3 sets - 10 reps - Supine Quad Set  - 5 x daily - 7 x weekly - 2 sets - 10 reps - 10 hold - Small Range Straight Leg Raise  - 1  x daily - 7 x weekly - 3 sets - 10 reps - Ice  - 5 x daily - 7 x weekly - 1 sets - 1 reps - 20 min hold  Treatment priorities   Eval                                                  ASSESSMENT:  CLINICAL IMPRESSION: Adalyna tolerated session well with no adverse reaction.  Pt with continued PROM flexion to 75 today following manual.  Improved  but weak quad contraction with estim and consistent small arc SLR with estim today with slight extensor lag.  EVAL: Brenda Mcneil is a 33 y.o. female who presents to clinic with signs and sxs consistent with expected L knee pain and stiffness following TKA on 2/24.  Her ext ROM looks very good.  She is quite limited in her flexion at  this point.  Pt will benefit from skilled therapy to address relevant deficits and return to active job.    OBJECTIVE IMPAIRMENTS: Pain, knee ROM, knee strength, gait, balance  ACTIVITY LIMITATIONS: walking, steps, squatting, bending, lifting, working  PERSONAL FACTORS: See medical history and pertinent history   REHAB POTENTIAL: Good  CLINICAL DECISION MAKING: Stable/uncomplicated  EVALUATION COMPLEXITY: Low   GOALS:   SHORT TERM GOALS: Target date: 04/26/2023   Dylan will be >75% HEP compliant to improve carryover between sessions and facilitate independent management of condition  Evaluation: ongoing Goal status: MET Pt reports adherence 04/12/23   [place] TERM GOALS: Target date: 06/21/2023   Mayelin will self report </= 3/10 pain during daily activities from evaluation to improve function in daily tasks  Evaluation/Baseline: 7/10 max pain Goal status: Ongoing   2.  Zayden will improve 10 meter max gait speed to 1.4 m/s (.1 m/s MCID) to show functional improvement in ambulation   Evaluation/Baseline: not tested Goal status: Ongoing   Norms:     3.  Ikesha will improve 30'' STS (MCID 2) to >/= 12x (w/ UE?: N) to show improved LE strength and improved transfers   Evaluation/Baseline: not tested Goal status: Ongoing   4.  Addaline will show a >/= 36 pt improvement in LEFS score (MCID is ~11% or 9 pts) as a proxy for functional improvement   Evaluation/Baseline: 13 pts Goal status: Ongoing   5.  Afreen will achieve 115 degrees knee flexion to improve ability to complete transfers, squat, and navigate steps  Evaluation/Baseline:  20 degrees Goal status: Ongoing 04/12/23: 45 degrees - see above chart   6.  Mercy will achieve 0 degrees knee extension to improve mechanics of gait  Evaluation/Baseline: lacking 4 degrees Goal status: Ongoing  7. Doniesha will be able to return to work, not limited by pain  Evaluation/Baseline: limited Goal status: Ongoing    PLAN: PT FREQUENCY: 1-2x/week  PT DURATION: 8 weeks  PLANNED INTERVENTIONS:  97164- PT Re-evaluation, 97110-Therapeutic exercises, 97530- Therapeutic activity, [id number]- Neuromuscular re-education, 97535- Self Care, [id number]- Manual therapy, [id number]- Gait training, [id number]- Aquatic Therapy, [phone]- Electrical stimulation (manual), [id number]- Vasopneumatic device, [id number]- Traction (mechanical), [id number]- Ionotophoresis 4mg /ml Dexamethasone, Taping, Dry Needling, Joint manipulation, and Spinal manipulation.   Kimberlee Nearing Cynithia Hakimi PT 04/24/2023, 9:18 AM

## 2023-04-26 ENCOUNTER — Ambulatory Visit: Payer: Self-pay | Admitting: Physical Therapy

## 2023-04-26 ENCOUNTER — Encounter: Payer: Self-pay | Admitting: Physical Therapy

## 2023-04-26 DIAGNOSIS — M25562 Pain in left knee: Secondary | ICD-10-CM | POA: Diagnosis not present

## 2023-04-26 DIAGNOSIS — M6281 Muscle weakness (generalized): Secondary | ICD-10-CM

## 2023-04-26 DIAGNOSIS — R2681 Unsteadiness on feet: Secondary | ICD-10-CM

## 2023-04-26 DIAGNOSIS — R600 Localized edema: Secondary | ICD-10-CM

## 2023-04-26 NOTE — Therapy (Signed)
 OUTPATIENT PHYSICAL THERAPY DAILY NOTE  Patient Name: Brenda Mcneil MRN: [id number] DOB:1990/03/19, 33 y.o., female Today's Date: 04/26/2023   PT End of Session - 04/26/23 0831     Visit Number 8    Date for PT Re-Evaluation 06/21/23    Authorization Type [place] [facility]    Authorization Time Period Approved 12 visits 04/24/23-06/04/23    Authorization - Number of Visits 15    PT Start Time 0830    PT Stop Time 0912    PT Time Calculation (min) 42 min             Past Medical History:  Diagnosis Date   Anemia    Biological false positive RPR test 03/02/2020   RPR reactive on 28 week labs on 02/24/20. With  Negative Tpal and titer 1:1, this is most likely false positive.  Repeat RPR in third trimester or when admitted to L&D. RPR NR at 37weeks.   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/26/2023   Procedure: TOTAL KNEE ARTHROPLASTY;  Surgeon: Joen Laura, MD;  Location: [facility];  Service: Orthopedics;  Laterality: Left;   Patient Active Problem List   Diagnosis Date Noted   Postpartum care following vaginal delivery 05/26/2020   Contraception management 05/26/2020    PCP: Aura Camps, FNP  REFERRING PROVIDER: Joen Laura, MD  THERAPY DIAG:  Left knee pain, unspecified chronicity  Muscle weakness  Unsteadiness on feet  Localized edema  REFERRING DIAG: L TKA 2/24  Rationale for Evaluation and Treatment:  Rehabilitation  SUBJECTIVE:  PERTINENT PAST HISTORY:  Multiple past ACL tears        PRECAUTIONS: None  WEIGHT BEARING RESTRICTIONS No  FALLS:  Has patient fallen in last 6 months? No, Number of falls: 0  MOI/History of condition:  Onset date: 2/24  SUBJECTIVE STATEMENT Pt reports that she was able to do a leg raise by herself   EVAL: Brenda Mcneil is a 33 y.o. female who presents to clinic with chief complaint of L knee pain and stiffness following TKA on 2/24.  She has a history of ACL reconstruction, one in 2006 and one in 2010.  Both grafts failed which resulted in  arthritis, ultimately leading to L TKA on 2/24.   Red flags:  denies malaise, erythema / streaking, and chills / night sweats  Pain:  Are you having pain? 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,000 steps  Assistive Device: FWW, SPC, no AD at baseline  Hand Dominance: na  Patient Goals/Specific Activities: reduce pain, walking large amounts, lifting, stairs   OBJECTIVE:   DIAGNOSTIC FINDINGS:  na  GENERAL OBSERVATION/GAIT: Slow antalgic gait using FWW  SENSATION: Light touch: Appears intact  PALPATION: Expected edema about L knee with mild lateral bruising at proximal tibia, dressing in place and clean  LE MMT:  MMT Right (Eval) Left (Eval)  Hip flexion (L2, L3) 4 3-*  Knee extension (L3) 4+ 3-*  Knee flexion 4+ 3-*  Hip abduction    Hip extension    Hip external rotation    Hip internal rotation  Hip adduction    Ankle dorsiflexion (L4)    Ankle plantarflexion (S1)    Ankle inversion    Ankle eversion    Great Toe ext (L5)    Grossly     (Blank rows = not tested, score listed is out of 5 possible points.  N = WNL, D = diminished, C = clear for gross weakness with myotome testing, * = concordant pain with testing)  LE ROM:  ROM Right (Eval) Left (Eval) Left 3/4 Left 04/10/23 Left 04/12/23 L 3/18 L 3/25 L 3/27  Hip flexion          Hip extension          Hip abduction          Hip adduction          Hip internal rotation          Hip external rotation          Knee extension -4 Lacking 4 Lacking 2        Knee flexion 130 20 degrees 40 degrees passive 42 45 50 prior to manual 65 with pain and OP following manual 75 with OP 80 with significant OP  Ankle dorsiflexion          Ankle plantarflexion          Ankle inversion          Ankle eversion           (Blank rows = not tested, N = WNL, * = concordant pain with testing)  Functional Tests  Eval                                                              PATIENT SURVEYS:  LEFS: 13/80   TODAY'S TREATMENT:   [facility] Adult PT Treatment:                                                DATE: 04/26/2023 Therapeutic Exercise: Bike Heel slide with strap - 20x Flexion step stretch - 8'' Squat with UE support SLR SAQ  [place]/HM Setting up home estim unit  Manual Therapy AP/PA mobs with distraction - patient edge of table Tailgate stretch with pin and stretch of quad Knee flexion stretch with bolster under knee, increasing height as able     HOME EXERCISE PROGRAM: Access Code: [email or web address] URL: [email or web address] Date: 04/03/2023 Prepared by: [name]  Exercises - Supine Ankle Pumps  - 8 x daily - 7 x weekly - 2 sets - 10 reps - Supine Heel Slide with Strap  - 2-3 x daily - 7 x weekly - 3 sets - 10 reps - Seated Hamstring Stretch  - 2-3 x daily - 7 x weekly - 1 sets - 3 reps - 45 hold - Seated Knee Flexion Extension AAROM with Overpressure  - 2-3 x daily - 7 x weekly - 3 sets - 10 reps - [place] Sitting 4 Way Patellar Glide  - 4 x daily - 7 x weekly - 3 sets - 10 reps - Supine Quad Set  - 5 x daily - 7 x weekly - 2 sets - 10 reps -  10 hold - Small Range Straight Leg Raise  - 1 x daily - 7 x weekly - 3 sets - 10 reps - Ice  - 5 x daily - 7 x weekly - 1 sets - 1 reps - 20 min hold  Treatment priorities   Eval                                                  ASSESSMENT:  CLINICAL IMPRESSION: Brenda Mcneil tolerated session well with no adverse reaction.  Pt with continued PROM flexion to  80 today following manual with significant OP.  Able to start bike with partial revolutions today with good benefit following manual.  Updated HEP.  Significant improvement in quad activation today with ability to complete SAQ and SLR.  EVAL: Brenda Mcneil is a 33 y.o. female who presents to clinic with signs and sxs consistent with expected L knee pain and stiffness following TKA on 2/24.  Her ext ROM looks very good.  She is quite limited in her flexion at  this point.  Pt will benefit from skilled therapy to address relevant deficits and return to active job.    OBJECTIVE IMPAIRMENTS: Pain, knee ROM, knee strength, gait, balance  ACTIVITY LIMITATIONS: walking, steps, squatting, bending, lifting, working  PERSONAL FACTORS: See medical history and pertinent history   REHAB POTENTIAL: Good  CLINICAL DECISION MAKING: Stable/uncomplicated  EVALUATION COMPLEXITY: Low   GOALS:   SHORT TERM GOALS: Target date: 04/26/2023   Brenda Mcneil will be >75% HEP compliant to improve carryover between sessions and facilitate independent management of condition  Evaluation: ongoing Goal status: MET Pt reports adherence 04/12/23   [place] TERM GOALS: Target date: 06/21/2023   Brenda Mcneil will self report </= 3/10 pain during daily activities from evaluation to improve function in daily tasks  Evaluation/Baseline: 7/10 max pain Goal status: Ongoing   2.  Brenda Mcneil will improve 10 meter max gait speed to 1.4 m/s (.1 m/s MCID) to show functional improvement in ambulation   Evaluation/Baseline: not tested Goal status: Ongoing   Norms:     3.  Brenda Mcneil will improve 30'' STS (MCID 2) to >/= 12x (w/ UE?: N) to show improved LE strength and improved transfers   Evaluation/Baseline: not tested Goal status: Ongoing   4.  Brenda Mcneil will show a >/= 36 pt improvement in LEFS score (MCID is ~11% or 9 pts) as a proxy for functional improvement   Evaluation/Baseline: 13 pts Goal status: Ongoing   5.  Brenda Mcneil will  achieve 115 degrees knee flexion to improve ability to complete transfers, squat, and navigate steps  Evaluation/Baseline: 20 degrees Goal status: Ongoing 04/12/23: 45 degrees - see above chart   6.  Brenda Mcneil will achieve 0 degrees knee extension to improve mechanics of gait  Evaluation/Baseline: lacking 4 degrees Goal status: Ongoing  7. Brenda Mcneil will be able to return to work, not limited by pain  Evaluation/Baseline: limited Goal status: Ongoing    PLAN: PT FREQUENCY: 1-2x/week  PT DURATION: 8 weeks  PLANNED INTERVENTIONS:  97164- PT Re-evaluation, 97110-Therapeutic exercises, 97530- Therapeutic activity, [id number]- Neuromuscular re-education, 97535- Self Care, 09811- Manual therapy, [id number]- Gait training, [id number]- Aquatic Therapy, [phone]- Electrical stimulation (manual), [id number]- Vasopneumatic device, [id number]- Traction (mechanical), [id number]- Ionotophoresis 4mg /ml Dexamethasone, Taping, Dry Needling, Joint manipulation, and Spinal manipulation.   Kimberlee Nearing Kathey Simer PT 04/26/2023, 9:22 AM

## 2023-05-01 ENCOUNTER — Ambulatory Visit: Payer: Self-pay | Attending: Orthopedic Surgery | Admitting: Physical Therapy

## 2023-05-01 ENCOUNTER — Encounter: Payer: Self-pay | Admitting: Physical Therapy

## 2023-05-01 DIAGNOSIS — M25562 Pain in left knee: Secondary | ICD-10-CM | POA: Diagnosis present

## 2023-05-01 DIAGNOSIS — R600 Localized edema: Secondary | ICD-10-CM | POA: Insufficient documentation

## 2023-05-01 DIAGNOSIS — M6281 Muscle weakness (generalized): Secondary | ICD-10-CM | POA: Diagnosis present

## 2023-05-01 DIAGNOSIS — R2681 Unsteadiness on feet: Secondary | ICD-10-CM | POA: Diagnosis present

## 2023-05-01 NOTE — Therapy (Signed)
 OUTPATIENT PHYSICAL THERAPY DAILY NOTE  Patient Name: Brenda Mcneil MRN: [id number] DOB:Jan 25, 1991, 33 y.o., female Today's Date: 05/01/2023   PT End of Session - 05/01/23 1000     Visit Number 9    Date for PT Re-Evaluation 06/21/23    Authorization Type [place] [facility]    Authorization Time Period Approved 12 visits 04/24/23-06/04/23    Authorization - Number of Visits 15    PT Start Time 1000    PT Stop Time 1040    PT Time Calculation (min) 40 min             Past Medical History:  Diagnosis Date   Anemia    Biological false positive RPR test 03/02/2020   RPR reactive on 28 week labs on 02/24/20. With  Negative Tpal and titer 1:1, this is most likely false positive.  Repeat RPR in third trimester or when admitted to L&D. RPR NR at 37weeks.   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/26/2023   Procedure: TOTAL KNEE ARTHROPLASTY;  Surgeon: Joen Laura, MD;  Location: [facility];  Service: Orthopedics;  Laterality: Left;   Patient Active Problem List   Diagnosis Date Noted   Postpartum care following vaginal delivery 05/26/2020   Contraception management 05/26/2020    PCP: Aura Camps, FNP  REFERRING PROVIDER: Joen Laura, MD  THERAPY DIAG:  Left knee pain, unspecified chronicity  Muscle weakness  Unsteadiness on feet  Localized edema  REFERRING DIAG: L TKA 2/24  Rationale for Evaluation and Treatment:  Rehabilitation  SUBJECTIVE:  PERTINENT PAST HISTORY:  Multiple past ACL tears        PRECAUTIONS: None  WEIGHT BEARING RESTRICTIONS No  FALLS:  Has patient fallen in last 6 months? No, Number of falls: 0  MOI/History of condition:  Onset date: 2/24  SUBJECTIVE STATEMENT Pt reports that she is gaining strength and feeling better consistently.    EVAL: Brenda Mcneil is a 33 y.o. female who presents to clinic with chief complaint of L knee pain and stiffness following TKA on 2/24.  She has a history of ACL reconstruction, one in 2006 and one in 2010.  Both grafts failed which resulted in  arthritis, ultimately leading to L TKA on 2/24.   Red flags:  denies malaise, erythema / streaking, and chills / night sweats  Pain:  Are you having pain? 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,000 steps  Assistive Device: FWW, SPC, no AD at baseline  Hand Dominance: na  Patient Goals/Specific Activities: reduce pain, walking large amounts, lifting, stairs   OBJECTIVE:   DIAGNOSTIC FINDINGS:  na  GENERAL OBSERVATION/GAIT: Slow antalgic gait using FWW  SENSATION: Light touch: Appears intact  PALPATION: Expected edema about L knee with mild lateral bruising at proximal tibia, dressing in place and clean  LE MMT:  MMT Right (Eval) Left (Eval)  Hip flexion (L2, L3) 4 3-*  Knee extension (L3) 4+ 3-*  Knee flexion 4+ 3-*  Hip abduction    Hip extension    Hip external rotation    Hip internal rotation  Hip adduction    Ankle dorsiflexion (L4)    Ankle plantarflexion (S1)    Ankle inversion    Ankle eversion    Great Toe ext (L5)    Grossly     (Blank rows = not tested, score listed is out of 5 possible points.  N = WNL, D = diminished, C = clear for gross weakness with myotome testing, * = concordant pain with testing)  LE ROM:  ROM Right (Eval) Left (Eval) Left 3/4 Left 04/10/23 Left 04/12/23 L 3/18 L 3/25 L 3/27 L 4/1  Hip flexion           Hip extension           Hip abduction           Hip adduction           Hip internal rotation           Hip external rotation           Knee  extension -4 Lacking 4 Lacking 2        Knee flexion 130 20 degrees 40 degrees passive 42 45 50 prior to manual 65 with pain and OP following manual 75 with OP 80 with significant OP 75 w/ op  Ankle dorsiflexion           Ankle plantarflexion           Ankle inversion           Ankle eversion            (Blank rows = not tested, N = WNL, * = concordant pain with testing)  Functional Tests  Eval                                                              PATIENT SURVEYS:  LEFS: 13/80   TODAY'S TREATMENT:   [facility] Adult PT Treatment:                                                DATE: 05/01/2023 Therapeutic Exercise: Nu-step - L5 starting seat 12 working to 10 for flexion ROM Heel slide with strap - 20x Flexion step stretch - 8'' TKE - GTB LAQ - 5x5 Cybex leg press - 20# for ROM - starting seat 8  Manual Therapy AP/PA mobs with distraction - patient edge of table Tailgate stretch with pin and stretch of quad Knee flexion stretch with bolster under knee, increasing height as able     HOME EXERCISE PROGRAM: Access Code: [email or web address] URL: [email or web address] Date: 05/01/2023 Prepared by: [name]  Exercises - Seated Knee Flexion Extension AAROM with Overpressure  - 2-3 x daily - 7 x weekly - 3 sets - 10 reps - Standing Knee Flexion Stretch on Step  - 2-3 x daily - 7 x weekly - 1 sets - 20 reps - Small Range Straight Leg Raise  - 2-4 x daily - 7 x weekly - 2 sets - 5 reps - Seated [place] Quad  - 2-4 x daily - 7 x weekly - 5 sets - 5 reps - Ice  - 5 x daily - 7  x weekly - 1 sets - 1 reps - 20 min hold  Treatment priorities   Eval                                                  ASSESSMENT:  CLINICAL IMPRESSION: Brenda Mcneil tolerated session well with no adverse reaction.  Continue to concentrate on regaining flexion ROM which remained about the same as last session.  Added in leg press and nu-step with varying seat positions to  maximize knee flexion.  Quad activation is coming along nicely and adding in some higher intensity exercises for this.  HEP updated.  EVAL: [name] is a 33 y.o. female who presents to clinic with signs and sxs consistent with expected L knee pain and stiffness following TKA on 2/24.  Her ext ROM looks very good.  She is quite limited in her flexion at  this point.  Pt will benefit from skilled therapy to address relevant deficits and return to active job.    OBJECTIVE IMPAIRMENTS: Pain, knee ROM, knee strength, gait, balance  ACTIVITY LIMITATIONS: walking, steps, squatting, bending, lifting, working  PERSONAL FACTORS: See medical history and pertinent history   REHAB POTENTIAL: Good  CLINICAL DECISION MAKING: Stable/uncomplicated  EVALUATION COMPLEXITY: Low   GOALS:   SHORT TERM GOALS: Target date: 04/26/2023   Sanda will be >75% HEP compliant to improve carryover between sessions and facilitate independent management of condition  Evaluation: ongoing Goal status: MET Pt reports adherence 04/12/23   [place] TERM GOALS: Target date: 06/21/2023   Jerriann will self report </= 3/10 pain during daily activities from evaluation to improve function in daily tasks  Evaluation/Baseline: 7/10 max pain Goal status: Ongoing   2.  Julius will improve 10 meter max gait speed to 1.4 m/s (.1 m/s MCID) to show functional improvement in ambulation   Evaluation/Baseline: not tested Goal status: Ongoing   Norms:     3.  Alayssa will improve 30'' STS (MCID 2) to >/= 12x (w/ UE?: N) to show improved LE strength and improved transfers   Evaluation/Baseline: not tested Goal status: Ongoing   4.  Sheilyn will show a >/= 36 pt improvement in LEFS score (MCID is ~11% or 9 pts) as a proxy for functional improvement   Evaluation/Baseline: 13 pts Goal status: Ongoing   5.  Trudy will achieve 115 degrees knee flexion to improve ability to complete transfers, squat, and navigate  steps  Evaluation/Baseline: 20 degrees Goal status: Ongoing 04/12/23: 45 degrees - see above chart   6.  Yomaris will achieve 0 degrees knee extension to improve mechanics of gait  Evaluation/Baseline: lacking 4 degrees Goal status: Ongoing  7. Aniella will be able to return to work, not limited by pain  Evaluation/Baseline: limited Goal status: Ongoing    PLAN: PT FREQUENCY: 1-2x/week  PT DURATION: 8 weeks  PLANNED INTERVENTIONS:  97164- PT Re-evaluation, 97110-Therapeutic exercises, 97530- Therapeutic activity, [id number]- Neuromuscular re-education, 97535- Self Care, [id number]- Manual therapy, [id number]- Gait training, [id number]- Aquatic Therapy, [phone]- Electrical stimulation (manual), [id number]- Vasopneumatic device, [id number]- Traction (mechanical), [id number]- Ionotophoresis 4mg /ml Dexamethasone, Taping, Dry Needling, Joint manipulation, and Spinal manipulation.   Kimberlee Nearing Johnda Billiot PT 05/01/2023, 10:42 AM

## 2023-05-03 ENCOUNTER — Encounter: Payer: Self-pay | Admitting: Physical Therapy

## 2023-05-03 ENCOUNTER — Ambulatory Visit: Payer: Self-pay | Admitting: Physical Therapy

## 2023-05-03 DIAGNOSIS — R600 Localized edema: Secondary | ICD-10-CM

## 2023-05-03 DIAGNOSIS — M6281 Muscle weakness (generalized): Secondary | ICD-10-CM

## 2023-05-03 DIAGNOSIS — M25562 Pain in left knee: Secondary | ICD-10-CM

## 2023-05-03 DIAGNOSIS — R2681 Unsteadiness on feet: Secondary | ICD-10-CM

## 2023-05-03 NOTE — Therapy (Signed)
 OUTPATIENT PHYSICAL THERAPY DAILY NOTE  Patient Name: Brenda Mcneil MRN: [id number] DOB:Oct 03, 1990, 33 y.o., female Today's Date: 05/03/2023   PT End of Session - 05/03/23 0833     Visit Number 10    Number of Visits 16    Date for PT Re-Evaluation 06/21/23    Authorization Type [place] [facility]    Authorization Time Period Approved 12 visits 04/24/23-06/04/23    Authorization - Number of Visits 15    PT Start Time 0831    PT Stop Time 0911    PT Time Calculation (min) 40 min             Past Medical History:  Diagnosis Date   Anemia    Biological false positive RPR test 03/02/2020   RPR reactive on 28 week labs on 02/24/20. With  Negative Tpal and titer 1:1, this is most likely false positive.  Repeat RPR in third trimester or when admitted to L&D. RPR NR at 37weeks.   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/26/2023   Procedure: TOTAL KNEE ARTHROPLASTY;  Surgeon: Brenda Laura, MD;  Location: [facility];  Service: Orthopedics;  Laterality: Left;   Patient Active Problem List   Diagnosis Date Noted   Postpartum care following vaginal delivery 05/26/2020   Contraception management 05/26/2020    PCP: Brenda Camps, FNP  REFERRING PROVIDER: Haskell Riling*  THERAPY DIAG:  Left knee pain, unspecified chronicity  Muscle weakness  Unsteadiness on feet  Localized edema  REFERRING DIAG: L TKA 2/24  Rationale for Evaluation and Treatment:  Rehabilitation  SUBJECTIVE:  PERTINENT PAST HISTORY:  Multiple past ACL tears        PRECAUTIONS: None  WEIGHT BEARING RESTRICTIONS No  FALLS:  Has patient fallen in last 6 months? No, Number of falls: 0  MOI/History of condition:  Onset date: 2/24  SUBJECTIVE STATEMENT Pt reports that she is gaining strength and feeling  better consistently.   EVAL: Brenda Mcneil is a 33 y.o. female who presents to clinic with chief complaint of L knee pain and stiffness following TKA on 2/24.  She has a history of ACL reconstruction, one in 2006 and one in 2010.  Both grafts failed which resulted in  arthritis, ultimately leading to L TKA on 2/24.   Red flags:  denies malaise, erythema / streaking, and chills / night sweats  Pain:  Are you having pain? 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,000 steps  Assistive Device: FWW, SPC, no AD at baseline  Hand Dominance: na  Patient Goals/Specific Activities: reduce pain, walking large amounts, lifting, stairs   OBJECTIVE:   DIAGNOSTIC FINDINGS:  na  GENERAL OBSERVATION/GAIT: Slow antalgic gait using FWW  SENSATION: Light touch: Appears intact  PALPATION: Expected edema about L knee with mild lateral bruising at proximal tibia, dressing in place and clean  LE MMT:  MMT Right (Eval) Left (Eval)  Hip flexion (L2, L3) 4 3-*  Knee extension (L3) 4+ 3-*  Knee flexion 4+ 3-*  Hip abduction    Hip extension    Hip external rotation  Hip internal rotation    Hip adduction    Ankle dorsiflexion (L4)    Ankle plantarflexion (S1)    Ankle inversion    Ankle eversion    Great Toe ext (L5)    Grossly     (Blank rows = not tested, score listed is out of 5 possible points.  N = WNL, D = diminished, C = clear for gross weakness with myotome testing, * = concordant pain with testing)  LE ROM:  ROM Right (Eval) Left (Eval) Left 3/4 Left 04/10/23 Left 04/12/23 L 3/18 L 3/25 L 3/27 L 4/1   Hip flexion            Hip extension            Hip abduction            Hip adduction            Hip internal rotation            Hip external  rotation            Knee extension -4 Lacking 4 Lacking 2         Knee flexion 130 20 degrees 40 degrees passive 42 45 50 prior to manual 65 with pain and OP following manual 75 with OP 80 with significant OP 75 w/ op   Ankle dorsiflexion            Ankle plantarflexion            Ankle inversion            Ankle eversion             (Blank rows = not tested, N = WNL, * = concordant pain with testing)  Functional Tests  Eval                                                              PATIENT SURVEYS:  LEFS: 13/80   TODAY'S TREATMENT:   [facility] Adult PT Treatment:                                                DATE: 05/03/2023 Therapeutic Exercise: Nu-step - L5 starting seat 12 working to 10 for flexion ROM Standing slant board stretch - 1' x2 Heel slide with strap - 20x Flexion step stretch - 12'' LAQ - 3x5 SLR - 4x5 Pilates leg press to flexion tolerance Squat with UE support  Manual Therapy AP/PA mobs with distraction - patient edge of table Tailgate stretch with pin and stretch of quad Knee flexion stretch with bolster under knee, increasing height as able     HOME EXERCISE PROGRAM: Access Code: [email or web address] URL: [email or web address] Date: 05/01/2023 Prepared by: Brenda Mcneil  Exercises - Seated Knee Flexion Extension AAROM with Overpressure  - 2-3 x daily - 7 x weekly - 3 sets - 10 reps - Standing Knee Flexion Stretch on Step  - 2-3 x daily - 7 x weekly - 1 sets - 20 reps - Small Range Straight Leg Raise  - 2-4 x daily - 7 x weekly - 2 sets - 5 reps - Seated [place]  Quad  - 2-4 x daily - 7 x weekly - 5 sets - 5 reps - Ice  - 5 x daily - 7 x weekly - 1 sets - 1 reps - 20 min hold  Treatment priorities   Eval                                                  ASSESSMENT:  CLINICAL IMPRESSION: Brenda Mcneil tolerated session well with no adverse reaction.  Continue to concentrate on regaining flexion ROM.  Updated HEP with added  exercises for quad strengthening and improving knee flexion.  She shows significant improvement in quad activation today.  EVAL: Brenda Mcneil is a 33 y.o. female who presents to clinic with signs and sxs consistent with expected L knee pain and stiffness following TKA on 2/24.  Her ext ROM looks very good.  She is quite limited in her flexion at  this point.  Pt will benefit from skilled therapy to address relevant deficits and return to active job.    OBJECTIVE IMPAIRMENTS: Pain, knee ROM, knee strength, gait, balance  ACTIVITY LIMITATIONS: walking, steps, squatting, bending, lifting, working  PERSONAL FACTORS: See medical history and pertinent history   REHAB POTENTIAL: Good  CLINICAL DECISION MAKING: Stable/uncomplicated  EVALUATION COMPLEXITY: Low   GOALS:   SHORT TERM GOALS: Target date: 04/26/2023   Hang will be >75% HEP compliant to improve carryover between sessions and facilitate independent management of condition  Evaluation: ongoing Goal status: MET Pt reports adherence 04/12/23   [place] TERM GOALS: Target date: 06/21/2023   Margareta will self report </= 3/10 pain during daily activities from evaluation to improve function in daily tasks  Evaluation/Baseline: 7/10 max pain Goal status: Ongoing   2.  Angelee will improve 10 meter max gait speed to 1.4 m/s (.1 m/s MCID) to show functional improvement in ambulation   Evaluation/Baseline: not tested Goal status: Ongoing   Norms:     3.  Palin will improve 30'' STS (MCID 2) to >/= 12x (w/ UE?: N) to show improved LE strength and improved transfers   Evaluation/Baseline: not tested Goal status: Ongoing   4.  Jazzalynn will show a >/= 36 pt improvement in LEFS score (MCID is ~11% or 9 pts) as a proxy for functional improvement   Evaluation/Baseline: 13 pts Goal status: Ongoing   5.  Shanikwa will achieve 115 degrees knee flexion to improve ability to complete transfers, squat, and navigate  steps  Evaluation/Baseline: 20 degrees Goal status: Ongoing 04/12/23: 45 degrees - see above chart   6.  Keylin will achieve 0 degrees knee extension to improve mechanics of gait  Evaluation/Baseline: lacking 4 degrees Goal status: Ongoing  7. Keimani will be able to return to work, not limited by pain  Evaluation/Baseline: limited Goal status: Ongoing    PLAN: PT FREQUENCY: 1-2x/week  PT DURATION: 8 weeks  PLANNED INTERVENTIONS:  97164- PT Re-evaluation, 97110-Therapeutic exercises, 97530- Therapeutic activity, [id number]- Neuromuscular re-education, 97535- Self Care, 16109- Manual therapy, [id number]- Gait training, [id number]- Aquatic Therapy, [phone]- Electrical stimulation (manual), [id number]- Vasopneumatic device, [id number]- Traction (mechanical), [id number]- Ionotophoresis 4mg /ml Dexamethasone, Taping, Dry Needling, Joint manipulation, and Spinal manipulation.   Kimberlee Nearing Bernadene Garside PT 05/03/2023, 9:31 AM

## 2023-05-08 ENCOUNTER — Encounter: Payer: Self-pay | Admitting: Physical Therapy

## 2023-05-08 ENCOUNTER — Ambulatory Visit: Payer: Self-pay | Admitting: Physical Therapy

## 2023-05-08 DIAGNOSIS — R600 Localized edema: Secondary | ICD-10-CM

## 2023-05-08 DIAGNOSIS — M25562 Pain in left knee: Secondary | ICD-10-CM

## 2023-05-08 DIAGNOSIS — M6281 Muscle weakness (generalized): Secondary | ICD-10-CM

## 2023-05-08 DIAGNOSIS — R2681 Unsteadiness on feet: Secondary | ICD-10-CM

## 2023-05-08 NOTE — Therapy (Signed)
 OUTPATIENT PHYSICAL THERAPY DAILY NOTE  Patient Name: Brenda Mcneil MRN: [id number] DOB:03-Feb-1990, 33 y.o., female Today's Date: 05/08/2023   PT End of Session - 05/08/23 0847     Visit Number 11    Number of Visits 16    Date for PT Re-Evaluation 06/21/23    Authorization Type [place] [facility]    Authorization Time Period Approved 12 visits 04/24/23-06/04/23    Authorization - Number of Visits 15    PT Start Time 0845    PT Stop Time 0925    PT Time Calculation (min) 40 min             Past Medical History:  Diagnosis Date   Anemia    Biological false positive RPR test 03/02/2020   RPR reactive on 28 week labs on 02/24/20. With  Negative Tpal and titer 1:1, this is most likely false positive.  Repeat RPR in third trimester or when admitted to L&D. RPR NR at 37weeks.   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/26/2023   Procedure: TOTAL KNEE ARTHROPLASTY;  Surgeon: Brenda Laura, MD;  Location: [facility];  Service: Orthopedics;  Laterality: Left;   Patient Active Problem List   Diagnosis Date Noted   Postpartum care following vaginal delivery 05/26/2020   Contraception management 05/26/2020    PCP: Brenda Camps, FNP  REFERRING PROVIDER: Haskell Mcneil*  THERAPY DIAG:  Left knee pain, unspecified chronicity  Muscle weakness  Unsteadiness on feet  Localized edema  REFERRING DIAG: L TKA 2/24  Rationale for Evaluation and Treatment:  Rehabilitation  SUBJECTIVE:  PERTINENT PAST HISTORY:  Multiple past ACL tears        PRECAUTIONS: None  WEIGHT BEARING RESTRICTIONS No  FALLS:  Has patient fallen in last 6 months? No, Number of falls: 0  MOI/History of condition:  Onset date: 2/24  SUBJECTIVE STATEMENT Pt reports she has been working on stretching at  home   EVAL: Brenda Mcneil is a 33 y.o. female who presents to clinic with chief complaint of L knee pain and stiffness following TKA on 2/24.  She has a history of ACL reconstruction, one in 2006 and one in 2010.  Both grafts failed which resulted in  arthritis, ultimately leading to L TKA on 2/24.   Red flags:  denies malaise, erythema / streaking, and chills / night sweats  Pain:  Are you having pain? 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,000 steps  Assistive Device: FWW, SPC, no AD at baseline  Hand Dominance: na  Patient Goals/Specific Activities: reduce pain, walking large amounts, lifting, stairs   OBJECTIVE:   DIAGNOSTIC FINDINGS:  na  GENERAL OBSERVATION/GAIT: Slow antalgic gait using FWW  SENSATION: Light touch: Appears intact  PALPATION: Expected edema about L knee with mild lateral bruising at proximal tibia, dressing in place and clean  LE MMT:  MMT Right (Eval) Left (Eval)  Hip flexion (L2, L3) 4 3-*  Knee extension (L3) 4+ 3-*  Knee flexion 4+ 3-*  Hip abduction    Hip extension    Hip external rotation  Hip internal rotation    Hip adduction    Ankle dorsiflexion (L4)    Ankle plantarflexion (S1)    Ankle inversion    Ankle eversion    Great Toe ext (L5)    Grossly     (Blank rows = not tested, score listed is out of 5 possible points.  N = WNL, D = diminished, C = clear for gross weakness with myotome testing, * = concordant pain with testing)  LE ROM:  ROM Right (Eval) Left (Eval) Left 3/4 Left 04/10/23 Left 04/12/23 L 3/18 L 3/25 L 3/27 L 4/1 L 4/1  Hip flexion            Hip extension            Hip abduction            Hip adduction            Hip internal rotation            Hip external rotation             Knee extension -4 Lacking 4 Lacking 2         Knee flexion 130 20 degrees 40 degrees passive 42 45 50 prior to manual 65 with pain and OP following manual 75 with OP 80 with significant OP 75 w/ op 78 w/ op  Ankle dorsiflexion            Ankle plantarflexion            Ankle inversion            Ankle eversion             (Blank rows = not tested, N = WNL, * = concordant pain with testing)  Functional Tests  Eval                                                              PATIENT SURVEYS:  LEFS: 13/80   TODAY'S TREATMENT:   [facility] Adult PT Treatment:                                                DATE: 05/08/2023 Therapeutic Exercise: 3' TM fwd, 3' retro propulsion for warm up Bike for ROM Heel raises  Add in balance next visit  Therapeutic Activity  Squat with UE support Wall squat Standing hip abd  Manual Therapy AP/PA mobs with distraction - patient edge of table Tailgate stretch with pin and stretch of quad Knee flexion stretch with bolster under knee, increasing height as able     HOME EXERCISE PROGRAM: AAccess Code: [email or web address] URL: [email or web address] Date: 05/08/2023 Prepared by: Brenda Mcneil  Exercises - Standing Knee Flexion Stretch on Step  - 2-3 x daily - 7 x weekly - 1 sets - 20 reps - Prone Quadriceps Stretch with Strap  - 2 x daily - 7 x weekly - 1 sets - 3 reps - 45 second hold - Seated [place]ebbetts Arc Quad  - 2-4 x daily - 7 x weekly - 5 sets - 5 reps - Standing Hip Abduction with Counter Support  - 1 x daily -  7 x weekly - 3 sets - 10 reps - Squat with Chair and Counter Support  - 1 x daily - 7 x weekly - 3 sets - 10 reps - Wall Squat  - 1 x daily - 7 x weekly - 3 sets - 10 reps  Treatment priorities   Eval                                                  ASSESSMENT:  CLINICAL IMPRESSION: Brenda Mcneil tolerated session well with no adverse reaction.  Pt making slow but consistent progress with  flexion ROM she is able to make rull rotation on bike today with significant hip compensation.  Updating HEP.  Will start to concentrate more on functional knee flexion and quad strengthening.   EVAL: Brenda Mcneil is a 33 y.o. female who presents to clinic with signs and sxs consistent with expected L knee pain and stiffness following TKA on 2/24.  Her ext ROM looks very good.  She is quite limited in her flexion at  this point.  Pt will benefit from skilled therapy to address relevant deficits and return to active job.    OBJECTIVE IMPAIRMENTS: Pain, knee ROM, knee strength, gait, balance  ACTIVITY LIMITATIONS: walking, steps, squatting, bending, lifting, working  PERSONAL FACTORS: See medical history and pertinent history   REHAB POTENTIAL: Good  CLINICAL DECISION MAKING: Stable/uncomplicated  EVALUATION COMPLEXITY: Low   GOALS:   SHORT TERM GOALS: Target date: 04/26/2023   Laretta will be >75% HEP compliant to improve carryover between sessions and facilitate independent management of condition  Evaluation: ongoing Goal status: MET Pt reports adherence 04/12/23   [place] TERM GOALS: Target date: 06/21/2023   Chaise will self report </= 3/10 pain during daily activities from evaluation to improve function in daily tasks  Evaluation/Baseline: 7/10 max pain Goal status: Ongoing   2.  Dallie will improve 10 meter max gait speed to 1.4 m/s (.1 m/s MCID) to show functional improvement in ambulation   Evaluation/Baseline: not tested Goal status: Ongoing   Norms:     3.  Larra will improve 30'' STS (MCID 2) to >/= 12x (w/ UE?: N) to show improved LE strength and improved transfers   Evaluation/Baseline: not tested Goal status: Ongoing   4.  Annelies will show a >/= 36 pt improvement in LEFS score (MCID is ~11% or 9 pts) as a proxy for functional improvement   Evaluation/Baseline: 13 pts Goal status: Ongoing   5.  Lonia will achieve 115 degrees knee flexion to  improve ability to complete transfers, squat, and navigate steps  Evaluation/Baseline: 20 degrees Goal status: Ongoing 04/12/23: 45 degrees - see above chart   6.  Erica will achieve 0 degrees knee extension to improve mechanics of gait  Evaluation/Baseline: lacking 4 degrees Goal status: Ongoing  7. Ximena will be able to return to work, not limited by pain  Evaluation/Baseline: limited Goal status: Ongoing    PLAN: PT FREQUENCY: 1-2x/week  PT DURATION: 8 weeks  PLANNED INTERVENTIONS:  97164- PT Re-evaluation, 97110-Therapeutic exercises, 97530- Therapeutic activity, [id number]- Neuromuscular re-education, 97535- Self Care, 16109- Manual therapy, [id number]- Gait training, [id number]- Aquatic Therapy, [id number]- Electrical stimulation (manual), [id number]- Vasopneumatic device, [id number]- Traction (mechanical), [id number]- Ionotophoresis 4mg /ml Dexamethasone, Taping, Dry Needling, Joint manipulation, and Spinal manipulation.   Kimberlee Nearing Rossie Scarfone PT 05/08/2023, 9:30 AM

## 2023-05-10 ENCOUNTER — Ambulatory Visit: Payer: Self-pay | Admitting: Physical Therapy

## 2023-05-10 ENCOUNTER — Encounter: Payer: Self-pay | Admitting: Physical Therapy

## 2023-05-10 DIAGNOSIS — M25562 Pain in left knee: Secondary | ICD-10-CM

## 2023-05-10 DIAGNOSIS — R2681 Unsteadiness on feet: Secondary | ICD-10-CM

## 2023-05-10 DIAGNOSIS — M6281 Muscle weakness (generalized): Secondary | ICD-10-CM

## 2023-05-10 DIAGNOSIS — R600 Localized edema: Secondary | ICD-10-CM

## 2023-05-10 NOTE — Therapy (Signed)
 OUTPATIENT PHYSICAL THERAPY DAILY NOTE  Patient Name: Brenda Mcneil MRN: [id number] DOB:01-07-1991, 33 y.o., female Today's Date: 05/10/2023   PT End of Session - 05/10/23 0845     Visit Number 12    Number of Visits 16    Date for PT Re-Evaluation 06/21/23    Authorization Type [place] [facility]    Authorization Time Period Approved 12 visits 04/24/23-06/04/23    Authorization - Number of Visits 15    PT Start Time 0845    PT Stop Time 0926    PT Time Calculation (min) 41 min             Past Medical History:  Diagnosis Date   Anemia    Biological false positive RPR test 03/02/2020   RPR reactive on 28 week labs on 02/24/20. With  Negative Tpal and titer 1:1, this is most likely false positive.  Repeat RPR in third trimester or when admitted to L&D. RPR NR at 37weeks.   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/26/2023   Procedure: TOTAL KNEE ARTHROPLASTY;  Surgeon: Joen Laura, MD;  Location: [facility];  Service: Orthopedics;  Laterality: Left;   Patient Active Problem List   Diagnosis Date Noted   Postpartum care following vaginal delivery 05/26/2020   Contraception management 05/26/2020    PCP: Aura Camps, FNP  REFERRING PROVIDER: Joen Laura, MD  THERAPY DIAG:  Left knee pain, unspecified chronicity  Muscle weakness  Unsteadiness on feet  Localized edema  REFERRING DIAG: L TKA 2/24  Rationale for Evaluation and Treatment:  Rehabilitation  SUBJECTIVE:  PERTINENT PAST HISTORY:  Multiple past ACL tears        PRECAUTIONS: None  WEIGHT BEARING RESTRICTIONS No  FALLS:  Has patient fallen in last 6 months? No, Number of falls: 0  MOI/History of condition:  Onset date: 2/24  SUBJECTIVE STATEMENT Pt reports she has been starting to try to walk  without an AD.  EVAL: Brenda Mcneil is a 33 y.o. female who presents to clinic with chief complaint of L knee pain and stiffness following TKA on 2/24.  She has a history of ACL reconstruction, one in 2006 and one in 2010.  Both grafts failed which resulted in  arthritis, ultimately leading to L TKA on 2/24.   Red flags:  denies malaise, erythema / streaking, and chills / night sweats  Pain:  Are you having pain? 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,000 steps  Assistive Device: FWW, SPC, no AD at baseline  Hand Dominance: na  Patient Goals/Specific Activities: reduce pain, walking large amounts, lifting, stairs   OBJECTIVE:   DIAGNOSTIC FINDINGS:  na  GENERAL OBSERVATION/GAIT: Slow antalgic gait using FWW  SENSATION: Light touch: Appears intact  PALPATION: Expected edema about L knee with mild lateral bruising at proximal tibia, dressing in place and clean  LE MMT:  MMT Right (Eval) Left (Eval)  Hip flexion (L2, L3) 4 3-*  Knee extension (L3) 4+ 3-*  Knee flexion 4+ 3-*  Hip abduction    Hip extension    Hip external rotation  Hip internal rotation    Hip adduction    Ankle dorsiflexion (L4)    Ankle plantarflexion (S1)    Ankle inversion    Ankle eversion    Great Toe ext (L5)    Grossly     (Blank rows = not tested, score listed is out of 5 possible points.  N = WNL, D = diminished, C = clear for gross weakness with myotome testing, * = concordant pain with testing)  LE ROM:  ROM Right (Eval) Left (Eval) Left 3/4 Left 04/10/23 Left 04/12/23 L 3/18 L 3/25 L 3/27 L 4/1 L 4/1  Hip flexion            Hip extension            Hip abduction            Hip adduction            Hip internal rotation            Hip external  rotation            Knee extension -4 Lacking 4 Lacking 2         Knee flexion 130 20 degrees 40 degrees passive 42 45 50 prior to manual 65 with pain and OP following manual 75 with OP 80 with significant OP 75 w/ op 78 w/ op  Ankle dorsiflexion            Ankle plantarflexion            Ankle inversion            Ankle eversion             (Blank rows = not tested, N = WNL, * = concordant pain with testing)  Functional Tests  Eval                                                              PATIENT SURVEYS:  LEFS: 13/80   TODAY'S TREATMENT:   [facility] Adult PT Treatment:                                                DATE: 05/10/2023 Therapeutic Exercise: 3' TM fwd, 3' retro propulsion for warm up Bike for ROM (not today) Slant board stretch - 1' x2 Heel raises (not today)  Neuromuscular re-ed: Tandem stance Tandem on foam SLS - challenging  Therapeutic Activity  Squat with UE support Hurdle step over in // - 10x ea - fwd and lat - light UE support as needed Standing hip abd Alternating lunge in // with UE support   HOME EXERCISE PROGRAM: AAccess Code: [email or web address] URL: [email or web address] Date: 05/08/2023 Prepared by: [name]  Exercises - Standing Knee Flexion Stretch on Step  - 2-3 x daily - 7 x weekly - 1 sets - 20 reps - Prone Quadriceps Stretch with Strap  - 2 x daily - 7 x weekly - 1 sets - 3 reps - 45 second hold - Seated [place] Arc Quad  - 2-4 x daily - 7 x weekly - 5 sets - 5 reps - Standing Hip Abduction with Counter Support  -  1 x daily - 7 x weekly - 3 sets - 10 reps - Squat with Chair and Counter Support  - 1 x daily - 7 x weekly - 3 sets - 10 reps - Wall Squat  - 1 x daily - 7 x weekly - 3 sets - 10 reps  Treatment priorities   Eval                                                  ASSESSMENT:  CLINICAL IMPRESSION: Brenda Mcneil tolerated session well with no adverse reaction.  Worked on transitioning to  functional knee flexion activities vs passive stretching today.  Pt with excellent flexion stretch with lunge and this was added to HEP.  Additionally, introduced balance activities; pt able to complete tandem stance with no issue but sill having difficulty with SLS.  EVAL: Brenda Mcneil is a 33 y.o. female who presents to clinic with signs and sxs consistent with expected L knee pain and stiffness following TKA on 2/24.  Her ext ROM looks very good.  She is quite limited in her flexion at  this point.  Pt will benefit from skilled therapy to address relevant deficits and return to active job.    OBJECTIVE IMPAIRMENTS: Pain, knee ROM, knee strength, gait, balance  ACTIVITY LIMITATIONS: walking, steps, squatting, bending, lifting, working  PERSONAL FACTORS: See medical history and pertinent history   REHAB POTENTIAL: Good  CLINICAL DECISION MAKING: Stable/uncomplicated  EVALUATION COMPLEXITY: Low   GOALS:   SHORT TERM GOALS: Target date: 04/26/2023   Brenda Mcneil will be >75% HEP compliant to improve carryover between sessions and facilitate independent management of condition  Evaluation: ongoing Goal status: MET Pt reports adherence 04/12/23   [place] TERM GOALS: Target date: 06/21/2023   Brenda Mcneil will self report </= 3/10 pain during daily activities from evaluation to improve function in daily tasks  Evaluation/Baseline: 7/10 max pain Goal status: Ongoing   2.  Brenda Mcneil will improve 10 meter max gait speed to 1.4 m/s (.1 m/s MCID) to show functional improvement in ambulation   Evaluation/Baseline: not tested Goal status: Ongoing   Norms:     3.  Brenda Mcneil will improve 30'' STS (MCID 2) to >/= 12x (w/ UE?: N) to show improved LE strength and improved transfers   Evaluation/Baseline: not tested Goal status: Ongoing   4.  Brenda Mcneil will show a >/= 36 pt improvement in LEFS score (MCID is ~11% or 9 pts) as a proxy for functional improvement   Evaluation/Baseline: 13 pts Goal  status: Ongoing   5.  Brenda Mcneil will achieve 115 degrees knee flexion to improve ability to complete transfers, squat, and navigate steps  Evaluation/Baseline: 20 degrees Goal status: Ongoing 04/12/23: 45 degrees - see above chart   6.  Brenda Mcneil will achieve 0 degrees knee extension to improve mechanics of gait  Evaluation/Baseline: lacking 4 degrees Goal status: Ongoing  7. Brenda Mcneil will be able to return to work, not limited by pain  Evaluation/Baseline: limited Goal status: Ongoing    PLAN: PT FREQUENCY: 1-2x/week  PT DURATION: 8 weeks  PLANNED INTERVENTIONS:  97164- PT Re-evaluation, 97110-Therapeutic exercises, 97530- Therapeutic activity, [id number]- Neuromuscular re-education, 97535- Self Care, 44010- Manual therapy, [id number]- Gait training, [id number]- Aquatic Therapy, [phone]- Electrical stimulation (manual), [id number]- Vasopneumatic device, [id number]- Traction (mechanical), [id number]- Ionotophoresis 4mg /ml Dexamethasone, Taping, Dry Needling, Joint manipulation, and Spinal manipulation.   Royal Hawthorn  E Severin Bou PT 05/10/2023, 11:02 AM

## 2023-05-15 ENCOUNTER — Ambulatory Visit: Payer: Self-pay | Admitting: Physical Therapy

## 2023-05-15 ENCOUNTER — Encounter: Payer: Self-pay | Admitting: Physical Therapy

## 2023-05-15 DIAGNOSIS — M25562 Pain in left knee: Secondary | ICD-10-CM

## 2023-05-15 DIAGNOSIS — R600 Localized edema: Secondary | ICD-10-CM

## 2023-05-15 DIAGNOSIS — R2681 Unsteadiness on feet: Secondary | ICD-10-CM

## 2023-05-15 DIAGNOSIS — M6281 Muscle weakness (generalized): Secondary | ICD-10-CM

## 2023-05-15 NOTE — Therapy (Signed)
 OUTPATIENT PHYSICAL THERAPY DAILY NOTE  Patient Name: Brenda Mcneil MRN: [id number] DOB:1990-02-16, 33 y.o., female Today's Date: 05/15/2023   PT End of Session - 05/15/23 0839     Visit Number 13    Number of Visits 16    Date for PT Re-Evaluation 06/21/23    Authorization Type [place] [facility]    Authorization Time Period Approved 12 visits 04/24/23-06/04/23    Authorization - Number of Visits 15    PT Start Time 0845    PT Stop Time 0925    PT Time Calculation (min) 40 min             Past Medical History:  Diagnosis Date   Anemia    Biological false positive RPR test 03/02/2020   RPR reactive on 28 week labs on 02/24/20. With  Negative Tpal and titer 1:1, this is most likely false positive.  Repeat RPR in third trimester or when admitted to L&D. RPR NR at 37weeks.   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/26/2023   Procedure: TOTAL KNEE ARTHROPLASTY;  Surgeon: Joen Laura, MD;  Location: [facility];  Service: Orthopedics;  Laterality: Left;   Patient Active Problem List   Diagnosis Date Noted   Postpartum care following vaginal delivery 05/26/2020   Contraception management 05/26/2020    PCP: Aura Camps, FNP  REFERRING PROVIDER: Joen Laura, MD  THERAPY DIAG:  Left knee pain, unspecified chronicity  Muscle weakness  Unsteadiness on feet  Localized edema  REFERRING DIAG: L TKA 2/24  Rationale for Evaluation and Treatment:  Rehabilitation  SUBJECTIVE:  PERTINENT PAST HISTORY:  Multiple past ACL tears        PRECAUTIONS: None  WEIGHT BEARING RESTRICTIONS No  FALLS:  Has patient fallen in last 6 months? No, Number of falls: 0  MOI/History of condition:  Onset date: 2/24  SUBJECTIVE STATEMENT Pt reports that she has been working on lunges and  feels this is getting a little better.  EVAL: Brenda Mcneil is a 33 y.o. female who presents to clinic with chief complaint of L knee pain and stiffness following TKA on 2/24.  She has a history of ACL reconstruction, one in 2006 and one in 2010.  Both grafts failed which resulted in  arthritis, ultimately leading to L TKA on 2/24.   Red flags:  denies malaise, erythema / streaking, and chills / night sweats  Pain:  Are you having pain? 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,000 steps  Assistive Device: FWW, SPC, no AD at baseline  Hand Dominance: na  Patient Goals/Specific Activities: reduce pain, walking large amounts, lifting, stairs   OBJECTIVE:   DIAGNOSTIC FINDINGS:  na  GENERAL OBSERVATION/GAIT: Slow antalgic gait using FWW  SENSATION: Light touch: Appears intact  PALPATION: Expected edema about L knee with mild lateral bruising at proximal tibia, dressing in place and clean  LE MMT:  MMT Right (Eval) Left (Eval)  Hip flexion (L2, L3) 4 3-*  Knee extension (L3) 4+ 3-*  Knee flexion 4+ 3-*  Hip abduction    Hip extension    Hip  external rotation    Hip internal rotation    Hip adduction    Ankle dorsiflexion (L4)    Ankle plantarflexion (S1)    Ankle inversion    Ankle eversion    Great Toe ext (L5)    Grossly     (Blank rows = not tested, score listed is out of 5 possible points.  N = WNL, D = diminished, C = clear for gross weakness with myotome testing, * = concordant pain with testing)  LE ROM:  ROM Right (Eval) Left (Eval) Left 3/4 Left 04/10/23 Left 04/12/23 L 3/18 L 3/25 L 3/27 L 4/1 L 4/1 L 4/15  Hip flexion             Hip extension             Hip abduction             Hip adduction             Hip internal  rotation             Hip external rotation             Knee extension -4 Lacking 4 Lacking 2          Knee flexion 130 20 degrees 40 degrees passive 42 45 50 prior to manual 65 with pain and OP following manual 75 with OP 80 with significant OP 75 w/ op 78 w/ op 86 w/ OP  Ankle dorsiflexion             Ankle plantarflexion             Ankle inversion             Ankle eversion              (Blank rows = not tested, N = WNL, * = concordant pain with testing)  Functional Tests  Eval                                                              PATIENT SURVEYS:  LEFS: 13/80   TODAY'S TREATMENT:   [facility] Adult PT Treatment:                                                DATE: 05/15/2023 Therapeutic Exercise: 3' retro propulsion for warm up (not today) LAQ - 2x10 ea, 0#, 2#, 4# Bike for ROM - with seatbelt to prevent hip hike Slant board stretch - 1' x2 (not today) Heel raises (not today)  Neuromuscular re-ed (not today): Tandem stance Tandem on foam SLS - challenging  Therapeutic Activity  Lateral walking with RTB at ankles - 2x to fatigue Alternating lunge in // with UE support Mini SL squat with bil UE support 4'' step down and retro step up - 2x10 4'' runners step up - light R UE support - 2x10  Consider sit to stand next visit   HOME EXERCISE PROGRAM: Access Code: [email or web address] URL: [email or web address] Date: 05/15/2023 Prepared by: Lesleigh Rash  Exercises - Standing Knee Flexion Stretch on Step  - 2-3 x daily - 7 x weekly - 1 sets -  20 reps - Prone Quadriceps Stretch with Strap  - 2 x daily - 7 x weekly - 1 sets - 3 reps - 45 second hold - Seated Reis Arc Quad  - 2-4 x daily - 7 x weekly - 5 sets - 5 reps - Squat with Chair and Counter Support  - 1 x daily - 7 x weekly - 3 sets - 10 reps - Wall Squat  - 1 x daily - 7 x weekly - 3 sets - 10 reps - Lunge with Counter Support  - 5 x daily - 7 x weekly - 2 sets - 10 reps - Single Leg  Stance  - 2 x daily - 6 x weekly - 1 sets - 3 reps - 30-60 sec hold - Side Stepping with Resistance at Ankles  - 1 x daily - 7 x weekly - laps 4-5 - Lower Quarter Anterior Reach  - 1 x daily - 7 x weekly - 3 sets - 10 reps  Treatment priorities   Eval                                                  ASSESSMENT:  CLINICAL IMPRESSION: Shalaunda tolerated session well with no adverse reaction.  Concentrated on eccentric quad control and flexion ROM.  Pt with significant improvement in flexion today.  Pt with continued signficant quad weakness with hip flexion compensation with cc quad exercises.  Will continue to work on this.  EVAL: Kittie is a 33 y.o. female who presents to clinic with signs and sxs consistent with expected L knee pain and stiffness following TKA on 2/24.  Her ext ROM looks very good.  She is quite limited in her flexion at  this point.  Pt will benefit from skilled therapy to address relevant deficits and return to active job.    OBJECTIVE IMPAIRMENTS: Pain, knee ROM, knee strength, gait, balance  ACTIVITY LIMITATIONS: walking, steps, squatting, bending, lifting, working  PERSONAL FACTORS: See medical history and pertinent history   REHAB POTENTIAL: Good  CLINICAL DECISION MAKING: Stable/uncomplicated  EVALUATION COMPLEXITY: Low   GOALS:   SHORT TERM GOALS: Target date: 04/26/2023   Marimar will be >75% HEP compliant to improve carryover between sessions and facilitate independent management of condition  Evaluation: ongoing Goal status: MET Pt reports adherence 04/12/23   Arif TERM GOALS: Target date: 06/21/2023   Mikaella will self report </= 3/10 pain during daily activities from evaluation to improve function in daily tasks  Evaluation/Baseline: 7/10 max pain Goal status: Ongoing   2.  Levita will improve 10 meter max gait speed to 1.4 m/s (.1 m/s MCID) to show functional improvement in ambulation   Evaluation/Baseline: not tested Goal  status: Ongoing   Norms:     3.  Syrina will improve 30'' STS (MCID 2) to >/= 12x (w/ UE?: N) to show improved LE strength and improved transfers   Evaluation/Baseline: not tested Goal status: Ongoing   4.  Lurlene will show a >/= 36 pt improvement in LEFS score (MCID is ~11% or 9 pts) as a proxy for functional improvement   Evaluation/Baseline: 13 pts Goal status: Ongoing   5.  Deepa will achieve 115 degrees knee flexion to improve ability to complete transfers, squat, and navigate steps  Evaluation/Baseline: 20 degrees Goal status: Ongoing 04/12/23: 45 degrees - see above chart  6.  Marua will achieve 0 degrees knee extension to improve mechanics of gait  Evaluation/Baseline: lacking 4 degrees Goal status: Ongoing  7. Angelina will be able to return to work, not limited by pain  Evaluation/Baseline: limited Goal status: Ongoing    PLAN: PT FREQUENCY: 1-2x/week  PT DURATION: 8 weeks  PLANNED INTERVENTIONS:  97164- PT Re-evaluation, 97110-Therapeutic exercises, 97530- Therapeutic activity, [id number]- Neuromuscular re-education, 97535- Self Care, [id number]- Manual therapy, [id number]- Gait training, [id number]- Aquatic Therapy, [phone]- Electrical stimulation (manual), [id number]- Vasopneumatic device, [id number]- Traction (mechanical), [id number]- Ionotophoresis 4mg /ml Dexamethasone, Taping, Dry Needling, Joint manipulation, and Spinal manipulation.   Jaquila Santelli E Brailen Macneal PT 05/15/2023, 9:30 AM

## 2023-05-17 ENCOUNTER — Ambulatory Visit: Payer: Self-pay | Admitting: Physical Therapy

## 2023-05-17 ENCOUNTER — Encounter: Payer: Self-pay | Admitting: Physical Therapy

## 2023-05-17 DIAGNOSIS — M25562 Pain in left knee: Secondary | ICD-10-CM

## 2023-05-17 DIAGNOSIS — M6281 Muscle weakness (generalized): Secondary | ICD-10-CM

## 2023-05-17 DIAGNOSIS — R600 Localized edema: Secondary | ICD-10-CM

## 2023-05-17 DIAGNOSIS — R2681 Unsteadiness on feet: Secondary | ICD-10-CM

## 2023-05-17 NOTE — Therapy (Signed)
 OUTPATIENT PHYSICAL THERAPY DAILY NOTE  Patient Name: Brenda Mcneil MRN: [id number] DOB:1990/05/23, 33 y.o., female Today's Date: 05/17/2023   PT End of Session - 05/17/23 0845     Visit Number 14    Number of Visits 16    Date for PT Re-Evaluation 06/21/23    Authorization Type [place] [facility]    Authorization Time Period Approved 12 visits 04/24/23-06/04/23    Authorization - Number of Visits 15    PT Start Time 0845    PT Stop Time 0910    PT Time Calculation (min) 25 min             Past Medical History:  Diagnosis Date   Anemia    Biological false positive RPR test 03/02/2020   RPR reactive on 28 week labs on 02/24/20. With  Negative Tpal and titer 1:1, this is most likely false positive.  Repeat RPR in third trimester or when admitted to L&D. RPR NR at 37weeks.   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/26/2023   Procedure: TOTAL KNEE ARTHROPLASTY;  Surgeon: Murleen Arms, MD;  Location: [facility];  Service: Orthopedics;  Laterality: Left;   Patient Active Problem List   Diagnosis Date Noted   Postpartum care following vaginal delivery 05/26/2020   Contraception management 05/26/2020    PCP: Versie Gores, FNP  REFERRING PROVIDER: Murleen Arms, MD  THERAPY DIAG:  Left knee pain, unspecified chronicity  Muscle weakness  Unsteadiness on feet  Localized edema  REFERRING DIAG: L TKA 2/24  Rationale for Evaluation and Treatment:  Rehabilitation  SUBJECTIVE:  PERTINENT PAST HISTORY:  Multiple past ACL tears        PRECAUTIONS: None  WEIGHT BEARING RESTRICTIONS No  FALLS:  Has patient fallen in last 6 months? No, Number of falls: 0  MOI/History of condition:  Onset date: 2/24  SUBJECTIVE STATEMENT Pt reports that she has been working on lunges and  feels this is getting a little better.  EVAL: Brenda Mcneil is a 33 y.o. female who presents to clinic with chief complaint of L knee pain and stiffness following TKA on 2/24.  She has a history of ACL reconstruction, one in 2006 and one in 2010.  Both grafts failed which resulted in  arthritis, ultimately leading to L TKA on 2/24.   Red flags:  denies malaise, erythema / streaking, and chills / night sweats  Pain:  Are you having pain? 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,000 steps  Assistive Device: FWW, SPC, no AD at baseline  Hand Dominance: na  Patient Goals/Specific Activities: reduce pain, walking large amounts, lifting, stairs   OBJECTIVE:   DIAGNOSTIC FINDINGS:  na  GENERAL OBSERVATION/GAIT: Slow antalgic gait using FWW  SENSATION: Light touch: Appears intact  PALPATION: Expected edema about L knee with mild lateral bruising at proximal tibia, dressing in place and clean  LE MMT:  MMT Right (Eval) Left (Eval)  Hip flexion (L2, L3) 4 3-*  Knee extension (L3) 4+ 3-*  Knee flexion 4+ 3-*  Hip abduction    Hip extension    Hip  external rotation    Hip internal rotation    Hip adduction    Ankle dorsiflexion (L4)    Ankle plantarflexion (S1)    Ankle inversion    Ankle eversion    Great Toe ext (L5)    Grossly     (Blank rows = not tested, score listed is out of 5 possible points.  N = WNL, D = diminished, C = clear for gross weakness with myotome testing, * = concordant pain with testing)  LE ROM:  ROM Right (Eval) Left (Eval) Left 3/4 Left 04/10/23 Left 04/12/23 L 3/18 L 3/25 L 3/27 L 4/1 L 4/1 L 4/15  Hip flexion             Hip extension             Hip abduction             Hip adduction             Hip internal  rotation             Hip external rotation             Knee extension -4 Lacking 4 Lacking 2          Knee flexion 130 20 degrees 40 degrees passive 42 45 50 prior to manual 65 with pain and OP following manual 75 with OP 80 with significant OP 75 w/ op 78 w/ op 86 w/ OP  Ankle dorsiflexion             Ankle plantarflexion             Ankle inversion             Ankle eversion              (Blank rows = not tested, N = WNL, * = concordant pain with testing)  Functional Tests  Eval                                                              PATIENT SURVEYS:  LEFS: 13/80   TODAY'S TREATMENT:   [facility] Adult PT Treatment:                                                DATE: 05/17/2023 Therapeutic Exercise: Elliptical - 5 min Knee ext machine - 3x12 - 5# Bike for ROM - with seatbelt to prevent hip hike  Neuromuscular re-ed (not today): Tandem stance Tandem on foam SLS - challenging  Therapeutic Activity  Bil leg press - 45# bil - 2x10 - SL 20# 3x10 Alternating lunge in // with UE support  Consider sit to stand next visit   HOME EXERCISE PROGRAM: Access Code: [email or web address] URL: [email or web address] Date: 05/15/2023 Prepared by: [name]  Exercises - Standing Knee Flexion Stretch on Step  - 2-3 x daily - 7 x weekly - 1 sets - 20 reps - Prone Quadriceps Stretch with Strap  - 2 x daily - 7 x weekly - 1 sets - 3 reps - 45 second hold - Seated [place] Arc Quad  - 2-4 x daily - 7 x weekly -  5 sets - 5 reps - Squat with Chair and Counter Support  - 1 x daily - 7 x weekly - 3 sets - 10 reps - Wall Squat  - 1 x daily - 7 x weekly - 3 sets - 10 reps - Lunge with Counter Support  - 5 x daily - 7 x weekly - 2 sets - 10 reps - Single Leg Stance  - 2 x daily - 6 x weekly - 1 sets - 3 reps - 30-60 sec hold - Side Stepping with Resistance at Ankles  - 1 x daily - 7 x weekly - laps 4-5 - Lower Quarter Anterior Reach  - 1 x daily - 7 x weekly - 3 sets -  10 reps  Treatment priorities   Eval 4/17        balance        Direct quad strengthening        Flexion ROM        Eccentric control                 ASSESSMENT:  CLINICAL IMPRESSION: Brenda Mcneil tolerated session well with no adverse reaction.  Worked on direct quad strengthening and knee flexion ROM.  Her ROM and quad strength and activation continue to improve.  She was able to tolerated bil knee ext machine today with significant fatigue noted.  Marked improvement on bike today with reduced hip hike  EVAL: Dariana is a 33 y.o. female who presents to clinic with signs and sxs consistent with expected L knee pain and stiffness following TKA on 2/24.  Her ext ROM looks very good.  She is quite limited in her flexion at  this point.  Pt will benefit from skilled therapy to address relevant deficits and return to active job.    OBJECTIVE IMPAIRMENTS: Pain, knee ROM, knee strength, gait, balance  ACTIVITY LIMITATIONS: walking, steps, squatting, bending, lifting, working  PERSONAL FACTORS: See medical history and pertinent history   REHAB POTENTIAL: Good  CLINICAL DECISION MAKING: Stable/uncomplicated  EVALUATION COMPLEXITY: Low   GOALS:   SHORT TERM GOALS: Target date: 04/26/2023   Nadege will be >75% HEP compliant to improve carryover between sessions and facilitate independent management of condition  Evaluation: ongoing Goal status: MET Pt reports adherence 04/12/23   Trent TERM GOALS: Target date: 06/21/2023   Ruther will self report </= 3/10 pain during daily activities from evaluation to improve function in daily tasks  Evaluation/Baseline: 7/10 max pain Goal status: Ongoing   2.  Rayel will improve 10 meter max gait speed to 1.4 m/s (.1 m/s MCID) to show functional improvement in ambulation   Evaluation/Baseline: not tested Goal status: Ongoing   Norms:     3.  Artie will improve 30'' STS (MCID 2) to >/= 12x (w/ UE?: N) to show improved LE  strength and improved transfers   Evaluation/Baseline: not tested Goal status: Ongoing   4.  Marshella will show a >/= 36 pt improvement in LEFS score (MCID is ~11% or 9 pts) as a proxy for functional improvement   Evaluation/Baseline: 13 pts Goal status: Ongoing   5.  Chaslyn will achieve 115 degrees knee flexion to improve ability to complete transfers, squat, and navigate steps  Evaluation/Baseline: 20 degrees Goal status: Ongoing 04/12/23: 45 degrees - see above chart   6.  Kiyana will achieve 0 degrees knee extension to improve mechanics of gait  Evaluation/Baseline: lacking 4 degrees Goal status: Ongoing  7. Geraldean will be able to  return to work, not limited by pain  Evaluation/Baseline: limited Goal status: Ongoing    PLAN: PT FREQUENCY: 1-2x/week  PT DURATION: 8 weeks  PLANNED INTERVENTIONS:  [id number]- PT Re-evaluation, 97110-Therapeutic exercises, 97530- Therapeutic activity, [id number]- Neuromuscular re-education, 97535- Self Care, 91478- Manual therapy, [id number]- Gait training, [id number]- Aquatic Therapy, [phone]- Electrical stimulation (manual), [id number]- Vasopneumatic device, [id number]- Traction (mechanical), [id number]- Ionotophoresis 4mg /ml Dexamethasone, Taping, Dry Needling, Joint manipulation, and Spinal manipulation.   Donovan Gallant Koren Sermersheim PT 05/17/2023, 9:28 AM

## 2023-05-22 ENCOUNTER — Ambulatory Visit: Admitting: Physical Therapy

## 2023-05-24 ENCOUNTER — Ambulatory Visit: Admitting: Physical Therapy

## 2023-05-24 NOTE — Patient Instructions (Signed)
 SURGICAL WAITING ROOM VISITATION  Patients having surgery or a procedure may have no more than 2 support people in the waiting area - these visitors may rotate.    Children under the age of 46 must have an adult with them who is not the patient.  Due to an increase in RSV and influenza rates and associated hospitalizations, children ages 11 and under may not visit patients in [facility] hospitals.  Visitors with respiratory illnesses are discouraged from visiting and should remain at home.  If the patient needs to stay at the hospital during part of their recovery, the visitor guidelines for inpatient rooms apply. Pre-op nurse will coordinate an appropriate time for 1 support person to accompany patient in pre-op.  This support person may not rotate.    Please refer to the [facility] website for the visitor guidelines for Inpatients (after your surgery is over and you are in a regular room).    Your procedure is scheduled on: 05/28/23   Report to [facility] Main Entrance    Report to admitting at 5:15 AM   Call this number if you have problems the morning of surgery [phone]   Do not eat food :After Midnight.   After Midnight you may have the following liquids until 4:15 AM DAY OF SURGERY  Water Non-Citrus Juices (without pulp, NO RED-Apple, White grape, White cranberry) Black Coffee (NO MILK/CREAM OR CREAMERS, sugar ok)  Clear Tea (NO MILK/CREAM OR CREAMERS, sugar ok) regular and decaf                             Plain Jell-O (NO RED)                                           Fruit ices (not with fruit pulp, NO RED)                                     Popsicles (NO RED)                                                               Sports drinks like Gatorade (NO RED)     The day of surgery:  Drink ONE (1) Pre-Surgery Clear Ensure or G2 at 4:15 AM the morning of surgery. Drink in one sitting. Do not sip.  This drink was given to you during your hospital  pre-op  appointment visit. Nothing else to drink after completing the  Pre-Surgery Clear Ensure or G2.          If you have questions, please contact your surgeon's office.   FOLLOW BOWEL PREP AND ANY ADDITIONAL PRE OP INSTRUCTIONS YOU RECEIVED FROM YOUR SURGEON'S OFFICE!!!     Oral Hygiene is also important to reduce your risk of infection.                                    Remember - BRUSH YOUR TEETH THE MORNING OF SURGERY WITH  YOUR REGULAR TOOTHPASTE  DENTURES WILL BE REMOVED PRIOR TO SURGERY PLEASE DO NOT APPLY "Poly grip" OR ADHESIVES!!!   Do NOT smoke after Midnight   Stop all vitamins and herbal supplements 7 days before surgery.   Take these medicines the morning of surgery with A SIP OF WATER: None   DO NOT TAKE ANY ORAL DIABETIC MEDICATIONS DAY OF YOUR SURGERY  Bring CPAP mask and tubing day of surgery.                              You may not have any metal on your body including hair pins, jewelry, and body piercing             Do not wear make-up, lotions, powders, perfumes, or deodorant  Do not wear nail polish including gel and S&S, artificial/acrylic nails, or any other type of covering on natural nails including finger and toenails. If you have artificial nails, gel coating, etc. that needs to be removed by a nail salon please have this removed prior to surgery or surgery may need to be canceled/ delayed if the surgeon/ anesthesia feels like they are unable to be safely monitored.   Do not shave  48 hours prior to surgery.    Do not bring valuables to the hospital. [facility] IS NOT             RESPONSIBLE   FOR VALUABLES.   Contacts, glasses, dentures or bridgework may not be worn into surgery.  DO NOT BRING YOUR HOME MEDICATIONS TO THE HOSPITAL. PHARMACY WILL DISPENSE MEDICATIONS LISTED ON YOUR MEDICATION LIST TO YOU DURING YOUR ADMISSION IN THE HOSPITAL!    Patients discharged on the day of surgery will not be allowed to drive home.  Someone NEEDS to stay with  you for the first 24 hours after anesthesia.   Special Instructions: Bring a copy of your healthcare power of attorney and living will documents the day of surgery if you haven't scanned them before.              Please read over the following fact sheets you were given: IF YOU HAVE QUESTIONS ABOUT YOUR PRE-OP INSTRUCTIONS PLEASE CALL [phone] Party    If you received a COVID test during your pre-op visit  it is requested that you wear a mask when out in public, stay away from anyone that may not be feeling well and notify your surgeon if you develop symptoms. If you test positive for Covid or have been in contact with anyone that has tested positive in the last 10 days please notify you surgeon.    [facility] - Preparing for Surgery Before surgery, you can play an important role.  Because skin is not sterile, your skin needs to be as free of germs as possible.  You can reduce the number of germs on your skin by washing with CHG (chlorahexidine gluconate) soap before surgery.  CHG is an antiseptic cleaner which kills germs and bonds with the skin to continue killing germs even after washing. Please DO NOT use if you have an allergy to CHG or antibacterial soaps.  If your skin becomes reddened/irritated stop using the CHG and inform your nurse when you arrive at Short Stay. Do not shave (including legs and underarms) for at least 48 hours prior to the first CHG shower.  You may shave your face/neck.  Please follow these instructions carefully:  1.  Shower  with CHG Soap the night before surgery and the  morning of surgery.  2.  If you choose to wash your hair, wash your hair first as usual with your normal  shampoo.  3.  After you shampoo, rinse your hair and body thoroughly to remove the shampoo.                             4.  Use CHG as you would any other liquid soap.  You can apply chg directly to the skin and wash.  Gently with a scrungie or clean washcloth.  5.  Apply the CHG Soap to  your body ONLY FROM THE NECK DOWN.   Do   not use on face/ open                           Wound or open sores. Avoid contact with eyes, ears mouth and   genitals (private parts).                       Wash face,  Genitals (private parts) with your normal soap.             6.  Wash thoroughly, paying special attention to the area where your    surgery  will be performed.  7.  Thoroughly rinse your body with warm water from the neck down.  8.  DO NOT shower/wash with your normal soap after using and rinsing off the CHG Soap.                9.  Pat yourself dry with a clean towel.            10.  Wear clean pajamas.            11.  Place clean sheets on your bed the night of your first shower and do not  sleep with pets. Day of Surgery : Do not apply any lotions/deodorants the morning of surgery.  Please wear clean clothes to the hospital/surgery center.  FAILURE TO FOLLOW THESE INSTRUCTIONS MAY RESULT IN THE CANCELLATION OF YOUR SURGERY  PATIENT SIGNATURE_________________________________  NURSE SIGNATURE__________________________________  ________________________________________________________________________

## 2023-05-24 NOTE — Progress Notes (Signed)
 Please place orders for PAT appointment scheduled 05/25/23.

## 2023-05-24 NOTE — Progress Notes (Signed)
 Patient interviewed over phone. Emailed instructions. Instructed to get CHG soap from pharmacy or if unable to use Dial. Transferred call to admitting.  COVID Vaccine Completed:  Date of COVID positive in last 90 days: no  PCP - Rafe Bunde FNP Cardiologist - n/a  Chest x-ray - n/a EKG - 03/01/23  Stress Test - n/a ECHO - n/a Cardiac Cath - n/a Pacemaker/ICD device last checked: n/a Spinal Cord Stimulator: n/a  Bowel Prep - no  Sleep Study - n/a CPAP -   Fasting Blood Sugar - n/a Checks Blood Sugar _____ times a day  Last dose of GLP1 agonist-  N/A GLP1 instructions:  Hold 7 days before surgery    Last dose of SGLT-2 inhibitors-  N/A SGLT-2 instructions:  Hold 3 days before surgery    Blood Thinner Instructions:  Last dose: n/a  Time: Aspirin  Instructions: Last Dose:  Activity level: Can go up a flight of stairs and perform activities of daily living without stopping and without symptoms of chest pain or shortness of breath.   Anesthesia review:   Patient denies shortness of breath, fever, cough and chest pain at PAT appointment  Patient verbalized understanding of instructions that were given to them at the PAT appointment. Patient was also instructed that they will need to review over the PAT instructions again at home before surgery.

## 2023-05-25 ENCOUNTER — Encounter (HOSPITAL_COMMUNITY): Payer: Self-pay

## 2023-05-25 ENCOUNTER — Encounter: Admitting: Physical Therapy

## 2023-05-25 ENCOUNTER — Other Ambulatory Visit: Payer: Self-pay

## 2023-05-25 ENCOUNTER — Encounter (HOSPITAL_COMMUNITY)
Admission: RE | Admit: 2023-05-25 | Discharge: 2023-05-25 | Disposition: A | Discharge status: Home / Self Care | Source: Ambulatory Visit | Attending: Orthopedic Surgery | Admitting: Orthopedic Surgery

## 2023-05-25 VITALS — Ht 68.0 in | Wt 160.0 lb

## 2023-05-25 DIAGNOSIS — Z01818 Encounter for other preprocedural examination: Secondary | ICD-10-CM

## 2023-05-28 ENCOUNTER — Encounter (HOSPITAL_COMMUNITY): Payer: Self-pay | Admitting: Orthopedic Surgery

## 2023-05-28 ENCOUNTER — Ambulatory Visit (HOSPITAL_COMMUNITY): Payer: Self-pay | Admitting: Anesthesiology

## 2023-05-28 ENCOUNTER — Other Ambulatory Visit: Payer: Self-pay

## 2023-05-28 ENCOUNTER — Encounter (HOSPITAL_COMMUNITY)
Admission: RE | Disposition: A | Discharge status: Home / Self Care | Payer: Self-pay | Source: Home / Self Care | Attending: Orthopedic Surgery

## 2023-05-28 ENCOUNTER — Ambulatory Visit (HOSPITAL_COMMUNITY): Payer: Self-pay | Admitting: Physician Assistant

## 2023-05-28 ENCOUNTER — Ambulatory Visit (HOSPITAL_COMMUNITY)
Admission: RE | Admit: 2023-05-28 | Discharge: 2023-05-28 | Disposition: A | Discharge status: Home / Self Care | Attending: Orthopedic Surgery | Admitting: Orthopedic Surgery

## 2023-05-28 DIAGNOSIS — M24662 Ankylosis, left knee: Secondary | ICD-10-CM | POA: Diagnosis present

## 2023-05-28 DIAGNOSIS — M1712 Unilateral primary osteoarthritis, left knee: Secondary | ICD-10-CM | POA: Insufficient documentation

## 2023-05-28 DIAGNOSIS — Z01818 Encounter for other preprocedural examination: Secondary | ICD-10-CM

## 2023-05-28 DIAGNOSIS — Z96652 Presence of left artificial knee joint: Secondary | ICD-10-CM | POA: Insufficient documentation

## 2023-05-28 HISTORY — PX: EXAM UNDER ANESTHESIA WITH MANIPULATION OF KNEE: SHX5816

## 2023-05-28 LAB — CBC
HCT: 37.6 % (ref 36.0–46.0)
Hemoglobin: 11.7 g/dL — ABNORMAL LOW (ref 12.0–15.0)
MCH: 27.2 pg (ref 26.0–34.0)
MCHC: 31.1 g/dL (ref 30.0–36.0)
MCV: 87.4 fL (ref 80.0–100.0)
Platelets: 200 10*3/uL (ref 150–400)
RBC: 4.3 MIL/uL (ref 3.87–5.11)
RDW: 12.8 % (ref 11.5–15.5)
WBC: 6.4 10*3/uL (ref 4.0–10.5)
nRBC: 0 % (ref 0.0–0.2)

## 2023-05-28 LAB — POCT PREGNANCY, URINE: Preg Test, Ur: NEGATIVE

## 2023-05-28 SURGERY — MANIPULATION, JOINT, KNEE, WITH ANESTHESIA
Anesthesia: General | Site: Knee | Laterality: Left

## 2023-05-28 MED ORDER — LACTATED RINGERS IV SOLN: INTRAVENOUS | Status: DC

## 2023-05-28 MED ORDER — FENTANYL CITRATE PF 50 MCG/ML IJ SOSY
PREFILLED_SYRINGE | INTRAMUSCULAR | Status: AC
  Filled 2023-05-28: qty 3

## 2023-05-28 MED ORDER — ONDANSETRON HCL 4 MG/2ML IJ SOLN
INTRAMUSCULAR | Status: AC
  Filled 2023-05-28: qty 2

## 2023-05-28 MED ORDER — DEXAMETHASONE SODIUM PHOSPHATE 10 MG/ML IJ SOLN
INTRAMUSCULAR | Status: DC | PRN
  Administered 2023-05-28: 10 mg via INTRAVENOUS

## 2023-05-28 MED ORDER — ONDANSETRON HCL 4 MG/2ML IJ SOLN
4.0000 mg | Freq: Four times a day (QID) | INTRAMUSCULAR | Status: AC | PRN
  Administered 2023-05-28: 4 mg via INTRAVENOUS

## 2023-05-28 MED ORDER — PROPOFOL 10 MG/ML IV BOLUS
INTRAVENOUS | Status: AC
  Filled 2023-05-28: qty 20

## 2023-05-28 MED ORDER — OXYCODONE HCL 5 MG PO TABS: 5.0000 mg | ORAL_TABLET | Freq: Once | ORAL | Status: DC | PRN

## 2023-05-28 MED ORDER — DEXAMETHASONE SODIUM PHOSPHATE 10 MG/ML IJ SOLN
INTRAMUSCULAR | Status: AC
  Filled 2023-05-28: qty 1

## 2023-05-28 MED ORDER — MIDAZOLAM HCL 2 MG/2ML IJ SOLN
INTRAMUSCULAR | Status: AC
  Filled 2023-05-28: qty 2

## 2023-05-28 MED ORDER — FENTANYL CITRATE (PF) 100 MCG/2ML IJ SOLN
INTRAMUSCULAR | Status: AC
Start: 2023-05-28 — End: ?
  Filled 2023-05-28: qty 2

## 2023-05-28 MED ORDER — FENTANYL CITRATE PF 50 MCG/ML IJ SOSY
25.0000 ug | PREFILLED_SYRINGE | INTRAMUSCULAR | Status: DC | PRN
  Administered 2023-05-28 (×2): 50 ug via INTRAVENOUS

## 2023-05-28 MED ORDER — PROPOFOL 10 MG/ML IV BOLUS
INTRAVENOUS | Status: DC | PRN
  Administered 2023-05-28: 200 mg via INTRAVENOUS

## 2023-05-28 MED ORDER — LIDOCAINE 2% (20 MG/ML) 5 ML SYRINGE
INTRAMUSCULAR | Status: DC | PRN
  Administered 2023-05-28: 70 mg via INTRAVENOUS

## 2023-05-28 MED ORDER — MIDAZOLAM HCL 5 MG/5ML IJ SOLN
INTRAMUSCULAR | Status: DC | PRN
  Administered 2023-05-28: 2 mg via INTRAVENOUS

## 2023-05-28 MED ORDER — CHLORHEXIDINE GLUCONATE 0.12 % MT SOLN
15.0000 mL | Freq: Once | OROMUCOSAL | Status: AC
  Administered 2023-05-28: 15 mL via OROMUCOSAL

## 2023-05-28 MED ORDER — OXYCODONE HCL 5 MG/5ML PO SOLN: 5.0000 mg | Freq: Once | ORAL | Status: DC | PRN

## 2023-05-28 MED ORDER — OXYCODONE HCL 5 MG PO TABS: 5.0000 mg | ORAL_TABLET | Freq: Four times a day (QID) | ORAL | 0 refills | Status: AC | PRN

## 2023-05-28 MED ORDER — ORAL CARE MOUTH RINSE: 15.0000 mL | Freq: Once | OROMUCOSAL | Status: AC

## 2023-05-28 SURGICAL SUPPLY — 14 items
BAG COUNTER SPONGE SURGICOUNT (BAG) IMPLANT
BNDG ADH 1X3 SHEER STRL LF (GAUZE/BANDAGES/DRESSINGS) IMPLANT
COVER SURGICAL LIGHT HANDLE (MISCELLANEOUS) ×1 IMPLANT
GAUZE SPONGE 4X4 12PLY STRL (GAUZE/BANDAGES/DRESSINGS) IMPLANT
GLOVE BIO SURGEON STRL SZ 6.5 (GLOVE) ×2 IMPLANT
GLOVE BIO SURGEON STRL SZ8 (GLOVE) ×2 IMPLANT
GLOVE BIOGEL PI IND STRL 7.0 (GLOVE) ×2 IMPLANT
GLOVE BIOGEL PI IND STRL 8 (GLOVE) ×1 IMPLANT
GLOVE SURG SS PI 7.0 STRL IVOR (GLOVE) ×1 IMPLANT
GOWN STRL REUS W/ TWL LRG LVL3 (GOWN DISPOSABLE) ×1 IMPLANT
KIT TURNOVER KIT A (KITS) IMPLANT
NDL SAFETY ECLIPSE 18X1.5 (NEEDLE) IMPLANT
SWABSTICK PVP IODINE (MISCELLANEOUS) ×1 IMPLANT
SYR CONTROL 10ML LL (SYRINGE) IMPLANT

## 2023-05-28 NOTE — H&P (Signed)
 ORTHOPAEDIC H&P   Chief Complaint: left knee stiffness after TKA   HPI: Brenda Mcneil is a 33 y.o. 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  She had a tough time the first couple of weeks after surgery with pain control and had very limited range of motion.  Ultimately the swelling and pain improved but she was only able to achieve about 90 degrees of flexion through physical therapy.  She denies distal numbness and tingling she denies pain other joints or extremities.  Past Medical History:  Diagnosis Date   Anemia    Biological false positive RPR test 03/02/2020   RPR reactive on 28 week labs on 02/24/20. With  Negative Tpal and titer 1:1, this is most likely false positive.  Repeat RPR in third trimester or when admitted to L&D. RPR NR at 37weeks.   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/26/2023   Procedure: TOTAL KNEE ARTHROPLASTY;  Surgeon: Murleen Arms, MD;  Location: [facility] ORS;  Service: Orthopedics;  Laterality: Left;   Social History   Socioeconomic History   Marital status: Married    Spouse name: Not on file   Number of children: Not on file   Years of education: Not on file   Highest education level: Not on file  Occupational History   Not on file  Tobacco Use   Smoking status: Never   Smokeless tobacco: Never  Vaping Use   Vaping status: Never Used  Substance and Sexual Activity   Alcohol  use: No   Drug use: No   Sexual activity: Not Currently    Partners: Male    Birth control/protection: Patch  Other Topics Concern   Not on file  Social History Narrative   Not on file   Social Drivers of Health   Financial Resource Strain: Not on file  Food Insecurity: No Food Insecurity  (01/09/2023)   Received from [facility]   Hunger Vital Sign    Worried About Running Out of Food in the Last Year: Never true    Ran Out of Food in the Last Year: Never true  Transportation Needs: No Transportation Needs (10/05/2022)   Received from [facility]   PRAPARE - Transportation    Lack of Transportation (Medical): No    Lack of Transportation (Non-Medical): No  Physical Activity: Not on file  Stress: Not on file  Social Connections: Not on file   History reviewed. No pertinent family history. Allergies  Allergen Reactions   Vicodin [Hydrocodone-Acetaminophen ] Nausea And Vomiting     Positive ROS: All other systems have been reviewed and were otherwise negative with the exception of those mentioned in the HPI and as above.  Physical Exam: General: Alert, no acute distress Cardiovascular: No pedal edema Respiratory: No cyanosis, no use of accessory musculature Skin: No lesions in the area of chief complaint Neurologic: Sensation intact distally Psychiatric: Patient is competent for consent with normal mood and affect  MUSCULOSKELETAL:  LLE well-healed anterior knee incision, mild expected postsurgical swelling  Knee range of motion 0 to 90 degrees  Nontender  No groin pain with log roll  Knee stable to varus/ valgus stress  Sens DPN, SPN, TN intact  Motor EHL, ext, flex 5/5  DP 2+,  PT 2+, No significant edema   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  Would benefit from manipulation under anesthesia to achieve additional range of motion by disrupting adhesions and scar tissue.  Benefits and risks including pain, fracture, swelling, were discussed with the patient.  She expresses understanding and agreement to proceed with procedure  today.    Murleen Arms, MD  Contact information:   [id number] 7am-5pm epic message Dr. Pryor Browning, or call office for patient follow up: [phone] After hours and holidays please check [email or web address] for group call information for [facility]

## 2023-05-28 NOTE — Transfer of Care (Signed)
 Immediate Anesthesia Transfer of Care Note  Patient: Brenda Mcneil  Procedure(s) Performed: MANIPULATION, JOINT, KNEE, WITH ANESTHESIA (Left: Knee)  Patient Location: PACU  Anesthesia Type:General  Level of Consciousness: awake, alert , oriented, and patient cooperative  Airway & Oxygen Therapy: Patient Spontanous Breathing and Patient connected to face mask oxygen  Post-op Assessment: Report given to RN and Post -op Vital signs reviewed and stable  Post vital signs: Reviewed and stable  Last Vitals:  Vitals Value Taken Time  BP 124/78 05/28/23 0716  Temp 36.8 C 05/28/23 0716  Pulse 83 05/28/23 0722  Resp 20 05/28/23 0722  SpO2 97 % 05/28/23 0722  Vitals shown include unfiled device data.  Last Pain:  Vitals:   05/28/23 0716  TempSrc:   PainSc: Asleep         Complications: No notable events documented.

## 2023-05-28 NOTE — Op Note (Signed)
 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 female with a diagnosis of left knee arthrofibrosis who underwent total knee arthroplasty on 03/26/23 and did well overall however has had stiffness with range of motion notably limited flexion.   The risks benefits and alternatives were discussed with the patient preoperatively including but not limited to the risks of infection, bleeding, nerve injury, cardiopulmonary complications, the need for revision surgery, among others, and the patient was willing to proceed.  ESTIMATED BLOOD LOSS: 0cc  OPERATIVE IMPLANTS: none  OPERATIVE FINDINGS: Improved range of motion 0 to 90 before manipulation and 0 to 120  after the manipulation  OPERATIVE PROCEDURE:  The patient was brought to the operating room.  Anesthesia was induced.  With the patient's supine on the stretcher the knee was assessed and found to be stable to varus valgus stress had full extension but flexion was limited to approximately 90 degrees.   With the hip flexed 90 degrees the knee was gently manipulated by putting pressure at the proximal tibia.  Small crackles were audibly heard as the adhesions were disrupted.  Once the manipulations was completed the range of motion was found to be 0 to 120.  The patient was awoken from anesthesia and taken to the recovery room in stable condition.  Post op recs: WB: WBAT Abx:not indicted Imaging: none Dressing: none DVT prophylaxis: not indicated Follow up: 2 weeks with Dr. Pryor Browning at [facility].  Address: [address], [place], [place] [address]  Office Phone: [phone]   Priscille Brought,  MD Orthopaedic Surgery

## 2023-05-28 NOTE — Discharge Instructions (Signed)
 Orthopedic Discharge Instructions  Diet: As you were doing prior to hospitalization   Shower:  You may shower as normal.  Activity:  Increase activity slowly as tolerated, but follow the weight bearing instructions below.  No driving restrictions from a surgical standpoint.  In addition, you cannot be taking narcotics while you drive, and you must feel in control of the vehicle.    Weight Bearing:   You may bear weight on the surgery leg as tolerated.    Blood clot prevention (DVT Prophylaxis): None needed for this procedure.  To prevent constipation: you may use a stool softener such as - Colace (over the counter) 100 mg by mouth twice a day  Drink plenty of fluids (prune juice may be helpful) and high fiber foods Miralax  (over the counter) for constipation as needed.    Itching:  If you experience itching with your medications, try taking only a single pain pill, or even half a pain pill at a time.  You may take up to 10 pain pills per day, and you can also use benadryl  over the counter for itching or also to help with sleep.   Precautions:  If you experience chest pain or shortness of breath - call 911 immediately for transfer to the hospital emergency department!!  Call office [phone]) for the following: Temperature greater than 101F Persistent nausea and vomiting Severe uncontrolled pain Redness, tenderness, or signs of infection (pain, swelling, redness, odor or green/yellow discharge around the site) Difficulty breathing, headache or visual disturbances Hives Persistent dizziness or light-headedness Extreme fatigue Any other questions or concerns you may have after discharge  In an emergency, call 911 or go to an Emergency Department at a nearby hospital  Follow- Up Appointment:  Please call for an appointment to be seen approximately 2-3 week after surgery in [facility] with your surgeon Dr. Priscille Brought - [phone] Address: [address],  [place], [place] [address]

## 2023-05-28 NOTE — Anesthesia Postprocedure Evaluation (Signed)
 Anesthesia Post Note  Patient: Brenda Mcneil  Procedure(s) Performed: MANIPULATION, JOINT, KNEE, WITH ANESTHESIA (Left: Knee)     Patient location during evaluation: PACU Anesthesia Type: General Level of consciousness: awake and alert Pain management: pain level controlled Vital Signs Assessment: post-procedure vital signs reviewed and stable Respiratory status: spontaneous breathing, nonlabored ventilation, respiratory function stable and patient connected to nasal cannula oxygen Cardiovascular status: blood pressure returned to baseline and stable Postop Assessment: no apparent nausea or vomiting Anesthetic complications: no   No notable events documented.  Last Vitals:  Vitals:   05/28/23 0800 05/28/23 0815  BP: 122/85 117/81  Pulse: 73 69  Resp: 15 17  Temp:  (!) 36.2 C  SpO2: 99% 100%    Last Pain:  Vitals:   05/28/23 0815  TempSrc:   PainSc: 0-No pain                 Kyliana Standen S

## 2023-05-28 NOTE — Anesthesia Preprocedure Evaluation (Signed)
 Anesthesia Evaluation  Patient identified by MRN, date of birth, ID band Patient awake    Reviewed: Allergy & Precautions, H&P , NPO status , Patient's Chart, lab work & pertinent test results  Airway Mallampati: II   Neck ROM: full    Dental   Pulmonary neg pulmonary ROS   breath sounds clear to auscultation       Cardiovascular negative cardio ROS  Rhythm:regular Rate:Normal     Neuro/Psych    GI/Hepatic   Endo/Other    Renal/GU      Musculoskeletal  (+) Arthritis ,    Abdominal   Peds  Hematology   Anesthesia Other Findings   Reproductive/Obstetrics                             Anesthesia Physical Anesthesia Plan  ASA: 2  Anesthesia Plan: General   Post-op Pain Management:    Induction: Intravenous  PONV Risk Score and Plan: 3 and Ondansetron , Dexamethasone , Midazolam  and Treatment may vary due to age or medical condition  Airway Management Planned: LMA  Additional Equipment:   Intra-op Plan:   Post-operative Plan: Extubation in OR  Informed Consent: I have reviewed the patients History and Physical, chart, labs and discussed the procedure including the risks, benefits and alternatives for the proposed anesthesia with the patient or authorized representative who has indicated his/her understanding and acceptance.     Dental advisory given  Plan Discussed with: CRNA, Anesthesiologist and Surgeon  Anesthesia Plan Comments:        Anesthesia Quick Evaluation

## 2023-05-29 ENCOUNTER — Ambulatory Visit: Admitting: Physical Therapy

## 2023-05-29 ENCOUNTER — Encounter (HOSPITAL_COMMUNITY): Payer: Self-pay | Admitting: Orthopedic Surgery

## 2023-05-29 DIAGNOSIS — R2681 Unsteadiness on feet: Secondary | ICD-10-CM

## 2023-05-29 DIAGNOSIS — M25562 Pain in left knee: Secondary | ICD-10-CM | POA: Diagnosis not present

## 2023-05-29 DIAGNOSIS — M6281 Muscle weakness (generalized): Secondary | ICD-10-CM

## 2023-05-29 DIAGNOSIS — R600 Localized edema: Secondary | ICD-10-CM

## 2023-05-29 NOTE — Therapy (Signed)
 OUTPATIENT PHYSICAL THERAPY DAILY NOTE  Patient Name: Brenda Mcneil MRN: [id number] DOB:1990-03-19, 33 y.o., female Today's Date: 05/29/2023   PT End of Session - 05/29/23 1149     Visit Number 15    Number of Visits 16    Date for PT Re-Evaluation 07/24/23    Authorization Type [employer]    Authorization Time Period Approved 12 visits 04/24/23-06/04/23    Authorization - Number of Visits 15    PT Start Time 1145    PT Stop Time 1226    PT Time Calculation (min) 41 min             Past Medical History:  Diagnosis Date   Anemia    Biological false positive RPR test 03/02/2020   RPR reactive on 28 week labs on 02/24/20. With  Negative Tpal and titer 1:1, this is most likely false positive.  Repeat RPR in third trimester or when admitted to L&D. RPR NR at 37weeks.   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   Procedure: TOTAL KNEE ARTHROPLASTY;  Surgeon: Murleen Arms, MD;  Location: [facility];  Service: Orthopedics;  Laterality: Left;   Patient Active Problem List   Diagnosis Date Noted   Postpartum care following vaginal delivery 05/26/2020   Contraception management 05/26/2020    PCP: Versie Gores, FNP  REFERRING PROVIDER: Murleen Arms, MD  THERAPY DIAG:  Left knee pain, unspecified chronicity - Plan: PT plan of care cert/re-cert  Muscle weakness - Plan: PT plan of care cert/re-cert  Unsteadiness on feet - Plan: PT plan of care cert/re-cert  Localized edema - Plan: PT plan of care cert/re-cert  REFERRING DIAG: L TKA 2/24  Rationale for  Evaluation and Treatment:  Rehabilitation  SUBJECTIVE:  PERTINENT PAST HISTORY:  Multiple past ACL tears        PRECAUTIONS: None  WEIGHT BEARING RESTRICTIONS No  FALLS:  Has patient fallen in last 6 months? No, Number of falls: 0  MOI/History of condition:  Onset date: 2/24  SUBJECTIVE STATEMENT Pt had a manipulation yesterday.  Surgeon wants her flexion to 120.  EVAL: Brenda Mcneil is a 33 y.o. female who presents to clinic with chief complaint of L knee pain and stiffness following TKA on 2/24.  She has a history of ACL reconstruction, one in 2006 and one in 2010.  Both grafts failed which resulted in  arthritis, ultimately leading to L TKA on 2/24.   Red flags:  denies malaise, erythema / streaking, and chills / night sweats  Pain:  Are you having pain? 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,000 steps  Assistive Device: FWW, SPC, no AD at baseline  Hand Dominance: na  Patient Goals/Specific Activities: reduce pain, walking large amounts, lifting, stairs   OBJECTIVE:   DIAGNOSTIC FINDINGS:  na  GENERAL OBSERVATION/GAIT: Slow antalgic gait using FWW  SENSATION: Light touch: Appears intact  PALPATION: Expected edema about L knee with mild lateral bruising at proximal tibia, dressing in place and clean  LE MMT:  MMT Right (Eval) Left (Eval)  Hip flexion (L2, L3) 4 3-*  Knee extension (L3) 4+ 3-*  Knee flexion 4+ 3-*  Hip abduction    Hip extension    Hip external rotation    Hip internal rotation    Hip adduction    Ankle dorsiflexion (L4)    Ankle plantarflexion (S1)    Ankle inversion    Ankle eversion    Great Toe ext (L5)    Grossly     (Blank rows = not tested, score listed is out of 5 possible points.  N = WNL, D =  diminished, C = clear for gross weakness with myotome testing, * = concordant pain with testing)  LE ROM:  ROM Right (Eval) Left (Eval) Left 3/4 Left 04/10/23 Left 04/12/23 L 3/18 L 3/25 L 3/27 L 4/1 L 4/1 L 4/15 L  Hip flexion              Hip extension              Hip abduction              Hip adduction              Hip internal rotation              Hip external rotation              Knee extension -4 Lacking 4 Lacking 2           Knee flexion 130 20 degrees 40 degrees passive 42 45 50 prior to manual 65 with pain and OP following manual 75 with OP 80 with significant OP 75 w/ op 78 w/ op 86 w/ OP 95 w/ OP 103 end of session w/ OP  Ankle dorsiflexion              Ankle plantarflexion              Ankle inversion              Ankle eversion               (Blank rows = not tested, N = WNL, * = concordant pain with testing)  Functional Tests  Eval                                                              PATIENT SURVEYS:  LEFS: 13/80   TODAY'S TREATMENT:   [facility] Adult PT Treatment:                                                DATE: 05/29/2023 Therapeutic Exercise: nu-step L5 23m while taking subjective and planning session with patient Lunge for flexion ROM Retro bike getting progressively closer for ROM - 5' Squat with UE support SLR in [place] sitting  Manual Therapy  Tailgate flexion stretch and mob Supine mob and manual flexion  Consider sit to stand next visit   HOME EXERCISE PROGRAM: Access Code: [email or web address] URL: [email or web address] Date: 05/15/2023  Prepared by: Lesleigh Rash  Exercises - Standing Knee Flexion Stretch on Step  - 2-3 x daily - 7 x weekly - 1 sets - 20 reps - Prone Quadriceps Stretch with Strap  - 2 x daily - 7 x weekly - 1 sets - 3 reps - 45 second hold - Seated Leja Arc Quad  - 2-4 x daily - 7 x weekly - 5 sets - 5 reps - Squat with Chair and Counter Support  - 1 x daily - 7 x weekly - 3 sets  - 10 reps - Wall Squat  - 1 x daily - 7 x weekly - 3 sets - 10 reps - Lunge with Counter Support  - 5 x daily - 7 x weekly - 2 sets - 10 reps - Single Leg Stance  - 2 x daily - 6 x weekly - 1 sets - 3 reps - 30-60 sec hold - Side Stepping with Resistance at Ankles  - 1 x daily - 7 x weekly - laps 4-5 - Lower Quarter Anterior Reach  - 1 x daily - 7 x weekly - 3 sets - 10 reps  Treatment priorities   Eval 4/17        balance        Direct quad strengthening        Flexion ROM        Eccentric control                 ASSESSMENT:  CLINICAL IMPRESSION: Sherriann tolerated session well with no adverse reaction.  First visit following MUA.  Flexion significantly improved.  Encouraged heavy concentration on flexion over the next couple of weeks.  EVAL: Prisila is a 33 y.o. female who presents to clinic with signs and sxs consistent with expected L knee pain and stiffness following TKA on 2/24.  Her ext ROM looks very good.  She is quite limited in her flexion at  this point.  Pt will benefit from skilled therapy to address relevant deficits and return to active job.    OBJECTIVE IMPAIRMENTS: Pain, knee ROM, knee strength, gait, balance  ACTIVITY LIMITATIONS: walking, steps, squatting, bending, lifting, working  PERSONAL FACTORS: See medical history and pertinent history   REHAB POTENTIAL: Good  CLINICAL DECISION MAKING: Stable/uncomplicated  EVALUATION COMPLEXITY: Low   GOALS:   SHORT TERM GOALS: Target date: 04/26/2023   Braelynn will be >75% HEP compliant to improve carryover between sessions and facilitate independent management of condition  Evaluation: ongoing Goal status: MET Pt reports adherence 04/12/23   Delis TERM GOALS: Target date: 06/21/2023 extended to 07/24/2023    Jalaila will self report </= 3/10 pain during daily activities from evaluation to improve function in daily tasks  Evaluation/Baseline: 7/10 max pain Goal status: Ongoing   2.  Mushtaq will  improve 10 meter max gait speed to 1.4 m/s (.1 m/s MCID) to show functional improvement in ambulation   Evaluation/Baseline: not tested Goal status: Ongoing   Norms:     3.  Shirly will improve 30'' STS (MCID 2) to >/= 12x (w/ UE?: N) to show improved LE strength and improved transfers   Evaluation/Baseline: not tested Goal status: Ongoing   4.  Berneda will show a >/= 36 pt improvement in LEFS score (MCID is ~11% or 9 pts) as a proxy for functional improvement   Evaluation/Baseline: 13 pts Goal status: Ongoing   5.  Xiomari will achieve 115 degrees knee flexion to improve ability to complete transfers, squat, and  navigate steps  Evaluation/Baseline: 20 degrees 04/12/23: 45 degrees - see above chart 4/29: 103 Goal status: Ongoing    6.  Renell will achieve 0 degrees knee extension to improve mechanics of gait  Evaluation/Baseline: lacking 4 degrees Goal status: Ongoing  7. Gisselle will be able to return to work, not limited by pain  Evaluation/Baseline: limited Goal status: Ongoing    PLAN: PT FREQUENCY: 1-3x/week  PT DURATION: 8 weeks  PLANNED INTERVENTIONS:  97164- PT Re-evaluation, 97110-Therapeutic exercises, 97530- Therapeutic activity, [id number]- Neuromuscular re-education, 97535- Self Care, 40981- Manual therapy, [id number]- Gait training, [id number]- Aquatic Therapy, [phone]- Electrical stimulation (manual), [id number]- Vasopneumatic device, [id number]- Traction (mechanical), [id number]- Ionotophoresis 4mg /ml Dexamethasone , Taping, Dry Needling, Joint manipulation, and Spinal manipulation.   Donovan Gallant Allee Busk PT 05/29/2023, 12:32 PM

## 2023-05-30 ENCOUNTER — Encounter: Payer: Self-pay | Admitting: Physical Therapy

## 2023-05-30 ENCOUNTER — Ambulatory Visit: Admitting: Physical Therapy

## 2023-05-30 DIAGNOSIS — M25562 Pain in left knee: Secondary | ICD-10-CM

## 2023-05-30 DIAGNOSIS — R600 Localized edema: Secondary | ICD-10-CM

## 2023-05-30 DIAGNOSIS — M6281 Muscle weakness (generalized): Secondary | ICD-10-CM

## 2023-05-30 DIAGNOSIS — R2681 Unsteadiness on feet: Secondary | ICD-10-CM

## 2023-05-30 NOTE — Therapy (Signed)
 OUTPATIENT PHYSICAL THERAPY DAILY NOTE  Patient Name: Brenda Mcneil MRN: [id number] DOB:1990-09-06, 33 y.o., female Today's Date: 05/30/2023   PT End of Session - 05/30/23 1110     Visit Number 16    Number of Visits 16    Date for PT Re-Evaluation 07/24/23    Authorization Type  MCD    Authorization Time Period Approved 12 visits 04/24/23-06/04/23, new auth submitted 4/30    Authorization - Number of Visits 15    PT Start Time 1100    PT Stop Time 1142    PT Time Calculation (min) 42 min             Past Medical History:  Diagnosis Date   Anemia    Biological false positive RPR test 03/02/2020   RPR reactive on 28 week labs on 02/24/20. With  Negative Tpal and titer 1:1, this is most likely false positive.  Repeat RPR in third trimester or when admitted to L&D. RPR NR at 37weeks.   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   Procedure: TOTAL KNEE ARTHROPLASTY;  Surgeon: Murleen Arms, MD;  Location: [facility];  Service: Orthopedics;  Laterality: Left;   Patient Active Problem List   Diagnosis Date Noted   Postpartum care following vaginal delivery 05/26/2020   Contraception management 05/26/2020    PCP: Versie Gores, FNP  REFERRING PROVIDER: Jesusa Moros*  THERAPY DIAG:  Left knee pain, unspecified chronicity  Muscle weakness  Unsteadiness on feet  Localized edema  REFERRING DIAG: L TKA 2/24  Rationale for Evaluation and Treatment:  Rehabilitation  SUBJECTIVE:  PERTINENT PAST HISTORY:  Multiple past ACL tears         PRECAUTIONS: None  WEIGHT BEARING RESTRICTIONS No  FALLS:  Has patient fallen in last 6 months? No, Number of falls: 0  MOI/History of condition:  Onset date: 2/24  SUBJECTIVE STATEMENT Pt had a manipulation yesterday.  Surgeon wants her flexion to 120.  EVAL: Brenda Mcneil is a 33 y.o. female who presents to clinic with chief complaint of L knee pain and stiffness following TKA on 2/24.  She has a history of ACL reconstruction, one in 2006 and one in 2010.  Both grafts failed which resulted in  arthritis, ultimately leading to L TKA on 2/24.   Red flags:  denies malaise, erythema / streaking, and chills / night sweats  Pain:  Are you having pain? 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,000 steps  Assistive Device: FWW, SPC, no AD at baseline  Hand Dominance: na  Patient Goals/Specific Activities: reduce pain, walking large amounts, lifting, stairs   OBJECTIVE:   DIAGNOSTIC FINDINGS:  na  GENERAL OBSERVATION/GAIT: Slow antalgic gait using FWW  SENSATION: Light touch: Appears intact  PALPATION: Expected edema about L knee with mild lateral bruising at proximal tibia, dressing in place and clean  LE MMT:  MMT Right (  Eval) Left (Eval)  Hip flexion (L2, L3) 4 3-*  Knee extension (L3) 4+ 3-*  Knee flexion 4+ 3-*  Hip abduction    Hip extension    Hip external rotation    Hip internal rotation    Hip adduction    Ankle dorsiflexion (L4)    Ankle plantarflexion (S1)    Ankle inversion    Ankle eversion    Great Toe ext (L5)    Grossly     (Blank rows = not tested, score listed is out of 5 possible points.  N = WNL, D = diminished, C = clear for gross weakness with myotome testing, * = concordant pain with testing)  LE ROM:  ROM  Right (Eval) Left (Eval) Left 3/4 Left 04/10/23 Left 04/12/23 L 3/18 L 3/25 L 3/27 L 4/1 L 4/1 L 4/15 L  Hip flexion              Hip extension              Hip abduction              Hip adduction              Hip internal rotation              Hip external rotation              Knee extension -4 Lacking 4 Lacking 2           Knee flexion 130 20 degrees 40 degrees passive 42 45 50 prior to manual 65 with pain and OP following manual 75 with OP 80 with significant OP 75 w/ op 78 w/ op 86 w/ OP 95 w/ OP 103 end of session w/ OP  Ankle dorsiflexion              Ankle plantarflexion              Ankle inversion              Ankle eversion               (Blank rows = not tested, N = WNL, * = concordant pain with testing)  Functional Tests  Eval                                                              PATIENT SURVEYS:  LEFS: 13/80   TODAY'S TREATMENT:   [facility] Adult PT Treatment:                                                DATE: 05/30/2023 Therapeutic Exercise: nu-step L5 58m while taking subjective and planning session with patient Retro bike getting progressively closer for ROM - 5' SLR in [place] sitting Knee ext machine - 5# - 2x10 bil  Cybex leg press - 20# - seat 7 -> 5 - 10x ea seat position  Manual Therapy  Tailgate flexion stretch and mob Supine mob and manual flexion  Therapeutic Activity  collecting information for goals, checking progress, and reviewing with patient Sit to stand with mirror for symmetry    HOME EXERCISE PROGRAM:  Access Code: [email or web address] URL: [email or web address] Date: 05/15/2023 Prepared by: Lesleigh Rash  Exercises - Standing Knee Flexion Stretch on Step  - 2-3 x daily - 7 x weekly - 1 sets - 20 reps - Prone Quadriceps Stretch with Strap  - 2 x daily - 7 x weekly - 1 sets - 3 reps - 45 second hold - Seated [place] Arc Quad  - 2-4 x daily - 7 x weekly - 5 sets - 5 reps - Squat with Chair and  Counter Support  - 1 x daily - 7 x weekly - 3 sets - 10 reps - Wall Squat  - 1 x daily - 7 x weekly - 3 sets - 10 reps - Lunge with Counter Support  - 5 x daily - 7 x weekly - 2 sets - 10 reps - Single Leg Stance  - 2 x daily - 6 x weekly - 1 sets - 3 reps - 30-60 sec hold - Side Stepping with Resistance at Ankles  - 1 x daily - 7 x weekly - laps 4-5 - Lower Quarter Anterior Reach  - 1 x daily - 7 x weekly - 3 sets - 10 reps  Treatment priorities   Eval 4/17        balance        Direct quad strengthening        Flexion ROM        Eccentric control                 ASSESSMENT:  CLINICAL IMPRESSION:  Brenda Mcneil has progressed well with therapy overall.  Prior to her TKA her knee flexion ROM was ~90 degrees and we were able to match that.  The surgeon recommended and MUA to improve her ROM beyond 90 degrees which was completed on 4/28.  Since the MUA Brenda Mcneil has further improved her knee flexion to >100 degrees and will likely continue to improve in the following weeks.  She has made good progress with her LE strength and endurance as well has normalizing her gait mechanics and speed.  Given her recent MUA and history of chronic knee pathology (limited ROM and high levels of pain) she will require continued PT to further improve her ROM and improve her strength.  This is particularly critical given her age, desired activity level, and work requirements.  EVAL: Brenda Mcneil is a 33 y.o. female who presents to clinic with signs and sxs consistent with expected L knee pain and stiffness following TKA on 2/24.  Her ext ROM looks very good.  She is quite limited in her flexion at  this point.  Pt will benefit from skilled therapy to address relevant deficits and return to active job.    OBJECTIVE IMPAIRMENTS: Pain, knee ROM, knee strength, gait, balance  ACTIVITY LIMITATIONS: walking, steps, squatting, bending, lifting, working  PERSONAL FACTORS: See medical history and pertinent history   REHAB  POTENTIAL: Good  CLINICAL DECISION MAKING: Stable/uncomplicated  EVALUATION COMPLEXITY: Low   GOALS:   SHORT TERM GOALS: Target date: 04/26/2023   Brenda Mcneil will be >75% HEP compliant to improve carryover between sessions and facilitate independent management of condition  Evaluation: ongoing Goal status: MET Pt reports adherence 04/12/23   Brenda Mcneil TERM GOALS: Target date: 06/21/2023 extended to 07/24/2023    Brenda Mcneil will self report </= 3/10 pain during daily activities from evaluation to improve function in daily tasks  Evaluation/Baseline: 7/10 max pain 4/30: 3/10 before MUA on 4/28, after 7/10 Goal status: Ongoing  2.  Brenda Mcneil will improve 10 meter max gait speed to 1.4 m/s (.1 m/s MCID) to show functional improvement in ambulation   Evaluation/Baseline: not tested 4/30: 1.1 m/s Goal status: Ongoing   Norms:     3.  Brenda Mcneil will improve 30'' STS (MCID 2) to >/= 12x (w/ UE?: N) to show improved LE strength and improved transfers   Evaluation/Baseline: not tested 4/30: 9x w/ UE support Goal status: Ongoing   4.  Brenda Mcneil will show a >/= 36 pt improvement in LEFS score (MCID is ~11% or 9 pts) as a proxy for functional improvement   Evaluation/Baseline: 13 pts 4/30: 59 pts Goal status: MET   5.  Jozelyn will achieve 115 degrees knee flexion to improve ability to complete transfers, squat, and navigate steps  Evaluation/Baseline: 20 degrees 04/12/23: 45 degrees - see above chart 4/29: 103 4/30: 105 s/p MUA Goal status: Ongoing   6.  Nikiya will achieve 0 degrees knee extension to improve mechanics of gait  Evaluation/Baseline: lacking 4 degrees 4/30: lacking 2 degrees Goal status: Ongoing  7. Maciah will be able to return to work, not limited by pain  Evaluation/Baseline: limited 4/30: still limited, needed MUA, but plans on returning after rehab is complete Goal status: Ongoing    PLAN: PT FREQUENCY: 1-3x/week  PT DURATION: 8 weeks  PLANNED  INTERVENTIONS:  97164- PT Re-evaluation, 97110-Therapeutic exercises, 97530- Therapeutic activity, 97112- Neuromuscular re-education, 97535- Self Care, 16109- Manual therapy, [id number]- Gait training, [id number]- Aquatic Therapy, [phone]- Electrical stimulation (manual), [id number]- Vasopneumatic device, [id number]- Traction (mechanical), [id number]- Ionotophoresis 4mg /ml Dexamethasone , Taping, Dry Needling, Joint manipulation, and Spinal manipulation.   Roran Wegner E Mila Pair PT 05/30/2023, 12:44 PM  I just finished a [facility] eval/recert.  Name: Mesa Mehrhoff  MRN: [id number] 12 visits  Check all conditions that are expected to impact treatment: Musculoskeletal disorders   I DID NOT put a charge in.  Check all possible CPT codes: 82956- Therapeutic Exercise, [phone]- Neuro Re-education, [phone] - Gait Training, [phone] - Manual Therapy, 97530 - Therapeutic Activities, 97535 - Self Care, [phone] - Re-evaluation, [id number] - Mechanical traction, and [id number] - Aquatic therapy   Thank you!  MCD - Secure

## 2023-05-31 ENCOUNTER — Encounter: Payer: Self-pay | Admitting: Physical Therapy

## 2023-05-31 ENCOUNTER — Ambulatory Visit: Attending: Orthopedic Surgery | Admitting: Physical Therapy

## 2023-05-31 ENCOUNTER — Ambulatory Visit: Admitting: Physical Therapy

## 2023-05-31 DIAGNOSIS — R2681 Unsteadiness on feet: Secondary | ICD-10-CM | POA: Diagnosis present

## 2023-05-31 DIAGNOSIS — M6281 Muscle weakness (generalized): Secondary | ICD-10-CM | POA: Insufficient documentation

## 2023-05-31 DIAGNOSIS — R600 Localized edema: Secondary | ICD-10-CM | POA: Insufficient documentation

## 2023-05-31 DIAGNOSIS — M25562 Pain in left knee: Secondary | ICD-10-CM | POA: Diagnosis present

## 2023-05-31 NOTE — Therapy (Signed)
 OUTPATIENT PHYSICAL THERAPY DAILY NOTE  Patient Name: Brenda Mcneil MRN: [id number] DOB:1990/07/27, 33 y.o., female Today's Date: 05/31/2023   PT End of Session - 05/31/23 1229     Visit Number 17    Number of Visits 18    Date for PT Re-Evaluation 07/24/23    Authorization Type [place] [employer]    Authorization Time Period Approved 12 visits 04/24/23-06/04/23, new auth submitted 4/30    Authorization - Number of Visits 15    PT Start Time 1225    PT Stop Time 1305    PT Time Calculation (min) 40 min             Past Medical History:  Diagnosis Date   Anemia    Biological false positive RPR test 03/02/2020   RPR reactive on 28 week labs on 02/24/20. With  Negative Tpal and titer 1:1, this is most likely false positive.  Repeat RPR in third trimester or when admitted to L&D. RPR NR at 37weeks.   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   Procedure: TOTAL KNEE ARTHROPLASTY;  Surgeon: Murleen Arms, MD;  Location: [facility];  Service: Orthopedics;  Laterality: Left;   Patient Active Problem List   Diagnosis Date Noted   Postpartum care following vaginal delivery 05/26/2020   Contraception management 05/26/2020    PCP: Versie Gores, FNP  REFERRING PROVIDER: Jesusa Moros*  THERAPY DIAG:  Left knee pain, unspecified chronicity  Muscle weakness  Unsteadiness on feet  Localized edema  REFERRING DIAG: L TKA 2/24  Rationale for Evaluation and Treatment:  Rehabilitation  SUBJECTIVE:  PERTINENT PAST HISTORY:  Multiple past ACL tears         PRECAUTIONS: None  WEIGHT BEARING RESTRICTIONS No  FALLS:  Has patient fallen in last 6 months? No, Number of falls: 0  MOI/History of condition:  Onset date: 2/24  SUBJECTIVE STATEMENT Pt has been working on stretches at home.  EVAL: Brenda Mcneil is a 33 y.o. female who presents to clinic with chief complaint of L knee pain and stiffness following TKA on 2/24.  She has a history of ACL reconstruction, one in 2006 and one in 2010.  Both grafts failed which resulted in  arthritis, ultimately leading to L TKA on 2/24.   Red flags:  denies malaise, erythema / streaking, and chills / night sweats  Pain:  Are you having pain? 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,000 steps  Assistive Device: FWW, SPC, no AD at baseline  Hand Dominance: na  Patient Goals/Specific Activities: reduce pain, walking large amounts, lifting, stairs   OBJECTIVE:   DIAGNOSTIC FINDINGS:  na  GENERAL OBSERVATION/GAIT: Slow antalgic gait using FWW  SENSATION: Light touch: Appears intact  PALPATION: Expected edema about L knee with mild lateral bruising at proximal tibia, dressing in place and clean  LE MMT:  MMT Right (Eval) Left (Eval)  Hip flexion (L2, L3) 4 3-*  Knee extension (L3) 4+ 3-*  Knee flexion 4+ 3-*  Hip abduction    Hip extension    Hip external rotation    Hip internal rotation    Hip adduction    Ankle dorsiflexion (L4)    Ankle plantarflexion (S1)    Ankle inversion    Ankle eversion    Great Toe ext (L5)    Grossly     (Blank rows = not tested, score listed is out of 5 possible points.  N = WNL, D = diminished, C = clear for gross weakness with myotome testing, * = concordant pain with testing)  LE ROM:  ROM Right (Eval) Left (Eval) Left 3/4  Left 04/10/23 Left 04/12/23 L 3/18 L 3/25 L 3/27 L 4/1 L 4/1 L 4/15 L  L 5/1  Hip flexion               Hip extension               Hip abduction               Hip adduction               Hip internal rotation               Hip external rotation               Knee extension -4 Lacking 4 Lacking 2            Knee flexion 130 20 degrees 40 degrees passive 42 45 50 prior to manual 65 with pain and OP following manual 75 with OP 80 with significant OP 75 w/ op 78 w/ op 86 w/ OP 95 w/ OP 103 end of session w/ OP 95 w/ OP at start of session 108  with OP at end of session  Ankle dorsiflexion               Ankle plantarflexion               Ankle inversion               Ankle eversion                (Blank rows = not tested, N = WNL, * = concordant pain with testing)  Functional Tests  Eval                                                              PATIENT SURVEYS:  LEFS: 13/80   TODAY'S TREATMENT:   [facility] Adult PT Treatment:                                                DATE: 05/31/2023 Therapeutic Exercise: nu-step L5 89m while taking subjective and planning session with patient HS kick out - 20x Plank - 2x to fatigue  Retro bike getting progressively closer for ROM - 5' Knee ext machine - 5# - 2x10 bil (not today) Cybex leg press - 20# - seat 7 -> 5 - 10x ea seat position - (not today)  Therapeutic Activity  Step  up with march - 2x10 - 6'' step Sled push/pull - 2 laps - 30# Cone taps - 20x On foam 20x   HOME EXERCISE PROGRAM: Access Code: [email or web address] URL: [email or web address] Date: 05/15/2023 Prepared by: Lesleigh Rash  Exercises - Standing Knee Flexion Stretch on Step  - 2-3 x daily - 7 x weekly - 1 sets - 20 reps - Prone Quadriceps Stretch with Strap  - 2 x daily - 7 x weekly - 1 sets - 3 reps - 45 second hold - Seated Babic Arc Quad  - 2-4 x daily - 7 x weekly - 5 sets - 5 reps - Squat with Chair and Counter Support  - 1 x  daily - 7 x weekly - 3 sets - 10 reps - Wall Squat  - 1 x daily - 7 x weekly - 3 sets - 10 reps - Lunge with Counter Support  - 5 x daily - 7 x weekly - 2 sets - 10 reps - Single Leg Stance  - 2 x daily - 6 x weekly - 1 sets - 3 reps - 30-60 sec hold - Side Stepping with Resistance at Ankles  - 1 x daily - 7 x weekly - laps 4-5 - Lower Quarter Anterior Reach  - 1 x daily - 7 x weekly - 3 sets - 10 reps  Treatment priorities   Eval 4/17        balance        Direct quad strengthening        Flexion ROM        Eccentric control                 ASSESSMENT:  CLINICAL IMPRESSION:  Contrina tolerated session well with no adverse reaction.  Continue to mainly focus on flexion ROM with consistent improvement since MUA to 108 with OP today.  Added in some SLS balance today which was tolerated well and Krina was able to complete cone taps on unstable surface with no issue.  EVAL: Simonne is a 33 y.o. female who presents to clinic with signs and sxs consistent with expected L knee pain and stiffness following TKA on 2/24.  Her ext ROM looks very good.  She is quite limited in her flexion at  this point.  Pt will benefit from skilled therapy to address relevant deficits and return to active job.    OBJECTIVE IMPAIRMENTS: Pain, knee ROM, knee strength, gait, balance  ACTIVITY LIMITATIONS: walking, steps, squatting, bending, lifting, working  PERSONAL FACTORS: See medical history and pertinent history   REHAB POTENTIAL: Good  CLINICAL DECISION MAKING: Stable/uncomplicated  EVALUATION COMPLEXITY: Low   GOALS:   SHORT TERM GOALS: Target date: 04/26/2023   Oleva will be >75% HEP compliant to improve carryover between sessions and facilitate independent management of condition  Evaluation: ongoing Goal status: MET Pt reports adherence 04/12/23   [place] TERM GOALS: Target date: 06/21/2023 extended to 07/24/2023    Kanaya will self report </= 3/10 pain during daily activities from  evaluation to improve function in daily tasks  Evaluation/Baseline: 7/10 max pain 4/30: 3/10 before MUA on 4/28, after 7/10 Goal status: Ongoing   2.  Tashae will improve 10 meter max gait speed to 1.4 m/s (.1 m/s MCID) to show functional improvement in ambulation   Evaluation/Baseline: not tested 4/30: 1.1 m/s Goal status: Ongoing   Norms:     3.  Nakhia will improve 30'' STS (MCID 2) to >/= 12x (w/ UE?: N) to show  improved LE strength and improved transfers   Evaluation/Baseline: not tested 4/30: 9x w/ UE support Goal status: Ongoing   4.  Amrita will show a >/= 36 pt improvement in LEFS score (MCID is ~11% or 9 pts) as a proxy for functional improvement   Evaluation/Baseline: 13 pts 4/30: 59 pts Goal status: MET   5.  Bibi will achieve 115 degrees knee flexion to improve ability to complete transfers, squat, and navigate steps  Evaluation/Baseline: 20 degrees 04/12/23: 45 degrees - see above chart 4/29: 103 4/30: 105 s/p MUA Goal status: Ongoing   6.  Carnie will achieve 0 degrees knee extension to improve mechanics of gait  Evaluation/Baseline: lacking 4 degrees 4/30: lacking 2 degrees Goal status: Ongoing  7. [place] will be able to return to work, not limited by pain  Evaluation/Baseline: limited 4/30: still limited, needed MUA, but plans on returning after rehab is complete Goal status: Ongoing    PLAN: PT FREQUENCY: 1-3x/week  PT DURATION: 8 weeks  PLANNED INTERVENTIONS:  97164- PT Re-evaluation, 97110-Therapeutic exercises, 97530- Therapeutic activity, 97112- Neuromuscular re-education, 97535- Self Care, 16109- Manual therapy, [id number]- Gait training, [id number]- Aquatic Therapy, [phone]- Electrical stimulation (manual), [id number]- Vasopneumatic device, [id number]- Traction (mechanical), [id number]- Ionotophoresis 4mg /ml Dexamethasone , Taping, Dry Needling, Joint manipulation, and Spinal manipulation.   Phoebe Marter E Karron Goens PT 05/31/2023, 1:23 PM

## 2023-06-01 ENCOUNTER — Encounter: Payer: Self-pay | Admitting: Physical Therapy

## 2023-06-01 ENCOUNTER — Ambulatory Visit: Admitting: Physical Therapy

## 2023-06-01 DIAGNOSIS — M6281 Muscle weakness (generalized): Secondary | ICD-10-CM

## 2023-06-01 DIAGNOSIS — R2681 Unsteadiness on feet: Secondary | ICD-10-CM

## 2023-06-01 DIAGNOSIS — M25562 Pain in left knee: Secondary | ICD-10-CM

## 2023-06-01 DIAGNOSIS — R600 Localized edema: Secondary | ICD-10-CM

## 2023-06-01 NOTE — Therapy (Unsigned)
 OUTPATIENT PHYSICAL THERAPY DAILY NOTE  Patient Name: Brenda Mcneil MRN: [id number] DOB:05-30-1990, 33 y.o., female Today's Date: 06/02/2023   PT End of Session - 06/01/23 1318     Visit Number 18    Number of Visits 18    Date for PT Re-Evaluation 07/24/23    Authorization Type [place] [employer]    Authorization Time Period Approved 12 visits 04/24/23-06/04/23, new auth submitted 5/2    Authorization - Number of Visits 15    PT Start Time 1315    PT Stop Time 1356    PT Time Calculation (min) 41 min             Past Medical History:  Diagnosis Date   Anemia    Biological false positive RPR test 03/02/2020   RPR reactive on 28 week labs on 02/24/20. With  Negative Tpal and titer 1:1, this is most likely false positive.  Repeat RPR in third trimester or when admitted to L&D. RPR NR at 37weeks.   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   Procedure: TOTAL KNEE ARTHROPLASTY;  Surgeon: Murleen Arms, MD;  Location: [facility];  Service: Orthopedics;  Laterality: Left;   Patient Active Problem List   Diagnosis Date Noted   Postpartum care following vaginal delivery 05/26/2020   Contraception management 05/26/2020    PCP: Versie Gores, FNP  REFERRING PROVIDER: Murleen Arms, MD  THERAPY DIAG:  Left knee pain, unspecified chronicity - Plan: PT plan of care cert/re-cert  Muscle weakness - Plan: PT plan of care cert/re-cert  Unsteadiness on feet - Plan: PT plan of care cert/re-cert  Localized edema - Plan: PT plan of care cert/re-cert  REFERRING DIAG: L  TKA 2/24  Rationale for Evaluation and Treatment:  Rehabilitation  SUBJECTIVE:  PERTINENT PAST HISTORY:  Multiple past ACL tears        PRECAUTIONS: None  WEIGHT BEARING RESTRICTIONS No  FALLS:  Has patient fallen in last 6 months? No, Number of falls: 0  MOI/History of condition:  Onset date: 2/24  SUBJECTIVE STATEMENT Pt reports her pain level is coming down and is around a 3/10 currently.  EVAL: Brenda Mcneil is a 33 y.o. female who presents to clinic with chief complaint of L knee pain and stiffness following TKA on 2/24.  She has a history of ACL reconstruction, one in 2006 and one in 2010.  Both grafts failed which resulted in  arthritis, ultimately leading to L TKA on 2/24.   Red flags:  denies malaise, erythema / streaking, and chills / night sweats  Pain:  Are you having pain? 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,000 steps  Assistive Device: FWW, SPC, no AD at baseline  Hand Dominance: na  Patient Goals/Specific Activities: reduce pain, walking large amounts, lifting, stairs   OBJECTIVE:   DIAGNOSTIC FINDINGS:  na  GENERAL OBSERVATION/GAIT: Slow antalgic gait using FWW  SENSATION: Light touch: Appears intact  PALPATION: Expected edema about L knee with mild lateral bruising at proximal tibia, dressing in place and clean  LE MMT:  MMT Right (Eval) Left (Eval)  Hip flexion (L2, L3) 4 3-*  Knee extension (L3) 4+ 3-*  Knee flexion 4+ 3-*  Hip abduction    Hip extension    Hip external rotation    Hip internal rotation    Hip adduction    Ankle dorsiflexion (L4)    Ankle plantarflexion (S1)    Ankle inversion    Ankle eversion    Great Toe ext (L5)    Grossly     (Blank rows = not tested, score listed is out of 5 possible  points.  N = WNL, D = diminished, C = clear for gross weakness with myotome testing, * = concordant pain with testing)  LE ROM:  ROM Right (Eval) Left (Eval) Left 3/4 Left 04/10/23 Left 04/12/23 L 3/18 L 3/25 L 3/27 L 4/1 L 4/1 L 4/15 L  L 5/1 L 5/2  Hip flexion                Hip extension                Hip abduction                Hip adduction                Hip internal rotation                Hip external rotation                Knee extension -4 Lacking 4 Lacking 2             Knee flexion 130 20 degrees 40 degrees passive 42 45 50 prior to manual 65 with pain and OP following manual 75 with OP 80 with significant OP 75 w/ op 78 w/ op 86 w/ OP 95 w/ OP 103 end of session w/ OP 95 w/ OP at start of session 108  with OP at end of session 110 w/ OP  Ankle dorsiflexion                Ankle plantarflexion                Ankle inversion                Ankle eversion                 (Blank rows = not tested, N = WNL, * = concordant pain with testing)  Functional Tests  Eval                                                              PATIENT SURVEYS:  LEFS: 13/80   TODAY'S TREATMENT:   [facility] Adult PT Treatment:                                                DATE: 06/02/2023 Therapeutic Exercise: nu-step L5 71m while taking subjective and planning session with patient Plank - 2x  to fatigue  Retro bike getting progressively closer for ROM - 8' Standing hip abd machine - 3x10 ea @ 25# Cybex leg press - 20# - seat 7 -> 5 - 10x ea seat position Knee ext with blue band - 2x10   Therapeutic Activity  Step up with march - x10 - 6'' step Step up and over 4'' step Hurdle walking   HOME EXERCISE PROGRAM: Access Code: [email or web address] URL: [email or web address] Date: 06/01/2023 Prepared by: Lesleigh Rash  Exercises - Prone Quadriceps Stretch with Strap  - 2 x daily - 7 x weekly - 1 sets - 3 reps - 45 second hold - Lunge with Counter  Support  - 5 x daily - 7 x weekly - 2 sets - 10 reps - Single Leg Stance  - 2 x daily - 6 x weekly - 1 sets - 3 reps - 30-60 sec hold - Seated Knee Extension with Resistance  - 1 x daily - 7 x weekly - 3 sets - 10 reps  Treatment priorities   Eval 4/17 5/3       balance flexion       Direct quad strengthening RF       Flexion ROM gait       Eccentric control                 ASSESSMENT:  CLINICAL IMPRESSION:  Jnae tolerated session well with no adverse reaction.  Continue to mainly focus on flexion ROM, reaching 100 with OP today.  Starting to reintroduce strengthening for hip and knee.  She continues to have RF tightness which should be addressed HEP updated.  EVAL: Hiliana is a 33 y.o. female who presents to clinic with signs and sxs consistent with expected L knee pain and stiffness following TKA on 2/24.  Her ext ROM looks very good.  She is quite limited in her flexion at  this point.  Pt will benefit from skilled therapy to address relevant deficits and return to active job.    OBJECTIVE IMPAIRMENTS: Pain, knee ROM, knee strength, gait, balance  ACTIVITY LIMITATIONS: walking, steps, squatting, bending, lifting, working  PERSONAL FACTORS: See medical history and pertinent history   REHAB POTENTIAL: Good  CLINICAL DECISION MAKING: Stable/uncomplicated  EVALUATION COMPLEXITY: Low   GOALS:   SHORT TERM GOALS: Target date: 04/26/2023   Sydny will be >75% HEP compliant to improve carryover between sessions and facilitate independent management of condition  Evaluation: ongoing Goal status: MET Pt reports adherence 04/12/23   Lua TERM GOALS: Target date: 06/21/2023 extended to 07/24/2023    Elvira will self report </= 3/10 pain during daily activities from evaluation to improve function in daily tasks  Evaluation/Baseline: 7/10 max pain 4/30: 3/10 before MUA on 4/28, after 7/10 5/2: 3/10 Goal status: Ongoing   2.  Latresia will improve 10 meter max gait  speed to 1.4 m/s (.1 m/s MCID) to show functional improvement in ambulation   Evaluation/Baseline: not tested 4/30: 1.1 m/s Goal status: Ongoing   Norms:     3.  Andres will improve 30'' STS (MCID 2) to >/= 12x (w/ UE?: N) to show improved LE strength and improved transfers   Evaluation/Baseline: not tested 4/30: 9x w/ UE support Goal status: Ongoing   4.  Dniya will show a >/= 36 pt improvement in LEFS score (MCID is ~11% or 9 pts) as a proxy for functional improvement   Evaluation/Baseline: 13 pts 4/30: 59 pts Goal status: MET   5.  Rosaelia will  achieve 115 degrees knee flexion to improve ability to complete transfers, squat, and navigate steps  Evaluation/Baseline: 20 degrees 04/12/23: 45 degrees - see above chart 4/29: 103 4/30: 105 s/p MUA 5/2: 110 w/ OP Goal status: Ongoing   6.  Shanik will achieve 0 degrees knee extension to improve mechanics of gait  Evaluation/Baseline: lacking 4 degrees 4/30: lacking 2 degrees Goal status: Ongoing  7. [place] will be able to return to work, not limited by pain  Evaluation/Baseline: limited 4/30: still limited, needed MUA, but plans on returning after rehab is complete Goal status: Ongoing    PLAN: PT FREQUENCY: 1-5x/week  PT DURATION: 8 weeks  PLANNED INTERVENTIONS:  97164- PT Re-evaluation, 97110-Therapeutic exercises, 97530- Therapeutic activity, 97112- Neuromuscular re-education, 97535- Self Care, 96045- Manual therapy, [id number]- Gait training, [id number]- Aquatic Therapy, [phone]- Electrical stimulation (manual), [id number]- Vasopneumatic device, [id number]- Traction (mechanical), [id number]- Ionotophoresis 4mg /ml Dexamethasone , Taping, Dry Needling, Joint manipulation, and Spinal manipulation.   Hensley Treat E Khianna Blazina PT 06/02/2023, 8:16 AM

## 2023-06-04 ENCOUNTER — Ambulatory Visit

## 2023-06-04 DIAGNOSIS — M6281 Muscle weakness (generalized): Secondary | ICD-10-CM

## 2023-06-04 DIAGNOSIS — R600 Localized edema: Secondary | ICD-10-CM

## 2023-06-04 DIAGNOSIS — M25562 Pain in left knee: Secondary | ICD-10-CM

## 2023-06-04 DIAGNOSIS — R2681 Unsteadiness on feet: Secondary | ICD-10-CM

## 2023-06-04 NOTE — Therapy (Signed)
 OUTPATIENT PHYSICAL THERAPY DAILY NOTE  Patient Name: Brenda Mcneil MRN: [id number] DOB:[birth year], 33 y.o., female Today's Date: 06/04/2023   PT End of Session - 06/04/23 1441     Visit Number 19    Number of Visits 30    Date for PT Re-Evaluation 07/24/23    Authorization Type [employer]    Authorization Time Period Approved 12 visits 04/24/23-06/04/23, new auth submitted 5/2    Authorization - Number of Visits 15    PT Start Time 1442    PT Stop Time 1525    PT Time Calculation (min) 43 min              Past Medical History:  Diagnosis Date   Anemia    Biological false positive RPR test 03/02/2020   RPR reactive on 28 week labs on 02/24/20. With  Negative Tpal and titer 1:1, this is most likely false positive.  Repeat RPR in third trimester or when admitted to L&D. RPR NR at 37weeks.   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   Procedure: TOTAL KNEE ARTHROPLASTY;  Surgeon: Murleen Arms, MD;  Location: [facility];  Service: Orthopedics;  Laterality: Left;   Patient Active Problem List   Diagnosis Date Noted   Postpartum care following vaginal delivery 05/26/2020   Contraception management 05/26/2020    PCP: Versie Gores, FNP  REFERRING PROVIDER: Murleen Arms, MD  THERAPY DIAG:  Left knee pain, unspecified chronicity  Muscle weakness  Unsteadiness on feet  Localized edema  REFERRING DIAG: L TKA 2/24  Rationale for Evaluation and Treatment:  Rehabilitation  SUBJECTIVE:  PERTINENT PAST HISTORY:  Multiple past ACL tears         PRECAUTIONS: None  WEIGHT BEARING RESTRICTIONS No  FALLS:  Has patient fallen in last 6 months? No, Number of falls: 0  MOI/History of condition:  Onset date: 2/24  SUBJECTIVE STATEMENT Pt presents to PT with reports of continued knee stiffness but notes it is continuing to get better.    EVAL: [name] is a 33 y.o. female who presents to clinic with chief complaint of L knee pain and stiffness following TKA on 2/24.  She has a history of ACL reconstruction, one in 2006 and one in 2010.  Both grafts failed which resulted in  arthritis, ultimately leading to L TKA on 2/24.   Red flags:  denies malaise, erythema / streaking, and chills / night sweats  Pain:  Are you having pain? 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,000 steps  Assistive Device: FWW, SPC, no AD at baseline  Hand Dominance: na  Patient Goals/Specific Activities: reduce pain, walking large amounts, lifting, stairs   OBJECTIVE:   DIAGNOSTIC FINDINGS:  na  GENERAL OBSERVATION/GAIT: Slow antalgic gait using FWW  SENSATION: Light touch: Appears intact  PALPATION: Expected edema about L knee with mild lateral bruising at proximal tibia, dressing  in place and clean  LE MMT:  MMT Right (Eval) Left (Eval)  Hip flexion (L2, L3) 4 3-*  Knee extension (L3) 4+ 3-*  Knee flexion 4+ 3-*  Hip abduction    Hip extension    Hip external rotation    Hip internal rotation    Hip adduction    Ankle dorsiflexion (L4)    Ankle plantarflexion (S1)    Ankle inversion    Ankle eversion    Great Toe ext (L5)    Grossly     (Blank rows = not tested, score listed is out of 5 possible points.  N = WNL, D = diminished, C = clear for gross weakness with myotome testing, * = concordant pain with  testing)  LE ROM:  ROM Right (Eval) Left (Eval) Left 3/4 Left 04/10/23 Left 04/12/23 L 3/18 L 3/25 L 3/27 L 4/1 L 4/1 L 4/15 L  L 5/1 L 5/2  Hip flexion                Hip extension                Hip abduction                Hip adduction                Hip internal rotation                Hip external rotation                Knee extension -4 Lacking 4 Lacking 2             Knee flexion 130 20 degrees 40 degrees passive 42 45 50 prior to manual 65 with pain and OP following manual 75 with OP 80 with significant OP 75 w/ op 78 w/ op 86 w/ OP 95 w/ OP 103 end of session w/ OP 95 w/ OP at start of session 108  with OP at end of session 110 w/ OP  Ankle dorsiflexion                Ankle plantarflexion                Ankle inversion                Ankle eversion                 (Blank rows = not tested, N = WNL, * = concordant pain with testing)  Functional Tests  Eval                                                              PATIENT SURVEYS:  LEFS: 13/80   TODAY'S TREATMENT:   [facility] Adult PT Treatment:                                                DATE: 06/04/2023 Therapeutic Exercise: Nu-step L5 40m while taking subjective and planning session with patient Knee step flex stretch x 15 - 5" hold Standing TKE green power cord 2x10 L Cybex leg press - 20# - seat  7 -> 5 - 10x ea seat position Standing hip abd machine - 3x10 ea @ 25# Retro bike getting progressively closer for ROM - 4' Therapeutic Activity: Step up with march - x10 - 6'' step Step up and over 4'' step Manual Therapy: Patellar mobs Pump handle knee mobs grade II  HOME EXERCISE PROGRAM: Access Code: [email or web address] URL: [email or web address] Date: 06/01/2023 Prepared by: Lesleigh Rash  Exercises - Prone Quadriceps Stretch with Strap  - 2 x daily - 7 x weekly - 1 sets - 3 reps - 45 second hold - Lunge with Counter Support  - 5 x daily - 7 x weekly - 2 sets - 10  reps - Single Leg Stance  - 2 x daily - 6 x weekly - 1 sets - 3 reps - 30-60 sec hold - Seated Knee Extension with Resistance  - 1 x daily - 7 x weekly - 3 sets - 10 reps  Treatment priorities   Eval 4/17 5/3       balance flexion       Direct quad strengthening RF       Flexion ROM gait       Eccentric control                 ASSESSMENT:  CLINICAL IMPRESSION: Pt was able to complete all prescribed exercises with no adverse effect. Exercises focused on improving knee flexion and strength post TKA and manipulation. Motion progressing well post manipulation, continues to benefit from skilled services at this time.   EVAL: Shanera is a 33 y.o. female who presents to clinic with signs and sxs consistent with expected L knee pain and stiffness following TKA on 2/24.  Her ext ROM looks very good.  She is quite limited in her flexion at  this point.  Pt will benefit from skilled therapy to address relevant deficits and return to active job.    OBJECTIVE IMPAIRMENTS: Pain, knee ROM, knee strength, gait, balance  ACTIVITY LIMITATIONS: walking, steps, squatting, bending, lifting, working  PERSONAL FACTORS: See medical history and pertinent history   REHAB POTENTIAL: Good  CLINICAL DECISION MAKING: Stable/uncomplicated  EVALUATION COMPLEXITY: Low   GOALS:   SHORT TERM GOALS: Target date: 04/26/2023   Marcea will be >75% HEP compliant to improve carryover between sessions and facilitate independent management of condition  Evaluation: ongoing Goal status: MET Pt reports adherence 04/12/23   [place]cos TERM GOALS: Target date: 06/21/2023 extended to 07/24/2023    Railee will self report </= 3/10 pain during daily activities from evaluation to improve function in daily tasks  Evaluation/Baseline: 7/10 max pain 4/30: 3/10 before MUA on 4/28, after 7/10 5/2: 3/10 Goal status: Ongoing   2.  Lashera will improve 10 meter max gait speed to 1.4 m/s (.1 m/s MCID) to show functional  improvement in ambulation   Evaluation/Baseline: not tested 4/30: 1.1 m/s Goal status: Ongoing   Norms:     3.  Jamiela will improve 30'' STS (MCID 2) to >/= 12x (w/ UE?: N) to show improved LE strength and improved transfers   Evaluation/Baseline: not tested 4/30: 9x w/ UE support Goal status: Ongoing   4.  Kyriah will show a >/= 36 pt improvement in LEFS score (MCID is ~11% or 9 pts) as a proxy for functional improvement   Evaluation/Baseline: 13 pts 4/30: 59 pts Goal status: MET   5.  Madelon will achieve 115 degrees knee flexion to improve ability to complete transfers, squat, and navigate steps  Evaluation/Baseline: 20  degrees 04/12/23: 45 degrees - see above chart 4/29: 103 4/30: 105 s/p MUA 5/2: 110 w/ OP Goal status: Ongoing   6.  Madisen will achieve 0 degrees knee extension to improve mechanics of gait  Evaluation/Baseline: lacking 4 degrees 4/30: lacking 2 degrees Goal status: Ongoing  7. Laityn will be able to return to work, not limited by pain  Evaluation/Baseline: limited 4/30: still limited, needed MUA, but plans on returning after rehab is complete Goal status: Ongoing    PLAN: PT FREQUENCY: 1-5x/week  PT DURATION: 8 weeks  PLANNED INTERVENTIONS:  97164- PT Re-evaluation, 97110-Therapeutic exercises, 97530- Therapeutic activity, 97112- Neuromuscular re-education, 97535- Self Care, 16109- Manual therapy, [id number]- Gait training, [id number]- Aquatic Therapy, [phone]- Electrical stimulation (manual), [id number]- Vasopneumatic device, [id number]- Traction (mechanical), [id number]- Ionotophoresis 4mg /ml Dexamethasone , Taping, Dry Needling, Joint manipulation, and Spinal manipulation.   Ivor Mars PT 06/04/2023, 3:29 PM

## 2023-06-06 ENCOUNTER — Encounter: Payer: Self-pay | Admitting: Physical Therapy

## 2023-06-06 ENCOUNTER — Ambulatory Visit: Admitting: Physical Therapy

## 2023-06-06 DIAGNOSIS — M25562 Pain in left knee: Secondary | ICD-10-CM | POA: Diagnosis not present

## 2023-06-06 DIAGNOSIS — R2681 Unsteadiness on feet: Secondary | ICD-10-CM

## 2023-06-06 DIAGNOSIS — R600 Localized edema: Secondary | ICD-10-CM

## 2023-06-06 DIAGNOSIS — M6281 Muscle weakness (generalized): Secondary | ICD-10-CM

## 2023-06-06 NOTE — Therapy (Signed)
 OUTPATIENT PHYSICAL THERAPY DAILY NOTE  Patient Name: Brenda Mcneil MRN: [id number] DOB:03/03/1990, 33 y.o., female Today's Date: 06/06/2023   PT End of Session - 06/06/23 1059     Visit Number 20    Number of Visits 30    Date for PT Re-Evaluation 07/24/23    Authorization Type [employer] [employer]    Authorization Time Period Approved 12 visits 04/24/23-06/04/23, new auth submitted 5/2, Approved 12 visits 06/04/23-07/16/23    Authorization - Number of Visits 27    PT Start Time 1100    PT Stop Time 1140    PT Time Calculation (min) 40 min              Past Medical History:  Diagnosis Date   Anemia    Biological false positive RPR test 03/02/2020   RPR reactive on 28 week labs on 02/24/20. With  Negative Tpal and titer 1:1, this is most likely false positive.  Repeat RPR in third trimester or when admitted to L&D. RPR NR at 37weeks.   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   Procedure: TOTAL KNEE ARTHROPLASTY;  Surgeon: Murleen Arms, MD;  Location: [facility];  Service: Orthopedics;  Laterality: Left;   Patient Active Problem List   Diagnosis Date Noted   Postpartum care following vaginal delivery 05/26/2020   Contraception management 05/26/2020    PCP: Versie Gores, FNP  REFERRING PROVIDER: Jesusa Moros*  THERAPY DIAG:  Left knee pain, unspecified chronicity  Muscle weakness  Unsteadiness on feet  Localized edema  REFERRING DIAG: L TKA 2/24  Rationale for Evaluation and Treatment:  Rehabilitation  SUBJECTIVE:  PERTINENT PAST HISTORY:   Multiple past ACL tears        PRECAUTIONS: None  WEIGHT BEARING RESTRICTIONS No  FALLS:  Has patient fallen in last 6 months? No, Number of falls: 0  MOI/History of condition:  Onset date: 2/24  SUBJECTIVE STATEMENT Pt reports she is doing well an feels she is walking more "normally"   EVAL: Brenda Mcneil is a 33 y.o. female who presents to clinic with chief complaint of L knee pain and stiffness following TKA on 2/24.  She has a history of ACL reconstruction, one in 2006 and one in 2010.  Both grafts failed which resulted in  arthritis, ultimately leading to L TKA on 2/24.   Red flags:  denies malaise, erythema / streaking, and chills / night sweats  Pain:  Are you having pain? 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,000 steps  Assistive Device: FWW, SPC, no AD at baseline  Hand Dominance: na  Patient Goals/Specific Activities: reduce pain, walking large amounts, lifting, stairs   OBJECTIVE:   DIAGNOSTIC FINDINGS:  na  GENERAL OBSERVATION/GAIT: Slow antalgic gait using FWW  SENSATION: Light touch: Appears intact  PALPATION: Expected edema about L knee with mild lateral bruising at proximal tibia, dressing in place and  clean  LE MMT:  MMT Right (Eval) Left (Eval)  Hip flexion (L2, L3) 4 3-*  Knee extension (L3) 4+ 3-*  Knee flexion 4+ 3-*  Hip abduction    Hip extension    Hip external rotation    Hip internal rotation    Hip adduction    Ankle dorsiflexion (L4)    Ankle plantarflexion (S1)    Ankle inversion    Ankle eversion    Great Toe ext (L5)    Grossly     (Blank rows = not tested, score listed is out of 5 possible points.  N = WNL, D = diminished, C = clear for gross weakness with myotome testing, * = concordant pain with  testing)  LE ROM:  ROM Right (Eval) Left (Eval) Left 3/4 Left 04/10/23 Left 04/12/23 L 3/18 L 3/25 L 3/27 L 4/1 L 4/1 L 4/15 L  L 5/1 L 5/2 L 5/7  Hip flexion                 Hip extension                 Hip abduction                 Hip adduction                 Hip internal rotation                 Hip external rotation                 Knee extension -4 Lacking 4 Lacking 2              Knee flexion 130 20 degrees 40 degrees passive 42 45 50 prior to manual 65 with pain and OP following manual 75 with OP 80 with significant OP 75 w/ op 78 w/ op 86 w/ OP 95 w/ OP 103 end of session w/ OP 95 w/ OP at start of session 108  with OP at end of session 110 w/ OP 113 w/ OP  Ankle dorsiflexion                 Ankle plantarflexion                 Ankle inversion                 Ankle eversion                  (Blank rows = not tested, N = WNL, * = concordant pain with testing)  Functional Tests  Eval                                                              PATIENT SURVEYS:  LEFS: 13/80   TODAY'S TREATMENT:   [facility] Adult PT Treatment:                                                DATE: 06/06/2023 Therapeutic Exercise: Bike - 5 min for ROM RF stretch in prone Hip flexion with band on feet - supine - concentration on  L knee to chest 3x to fatigue Knee ext machine - 3x10 - 5# HS curl machine 25# - 3x10  Therapeutic Activity: Cone step overs Step up and over 4'' step Squat to table With 10# KB DF lift - 10# SL deadlift - 10x w/ slider   HOME EXERCISE PROGRAM: Access Code: [email or web address] URL: [email or web address] Date: 06/01/2023 Prepared by: Lesleigh Rash  Exercises - Prone Quadriceps Stretch with Strap  - 2 x daily - 7 x weekly - 1 sets - 3 reps - 45 second hold - Lunge with Counter Support  - 5 x daily - 7 x weekly - 2 sets - 10 reps - Single Leg Stance  - 2 x daily - 6 x weekly - 1 sets - 3 reps - 30-60 sec hold - Seated  Knee Extension with Resistance  - 1 x daily - 7 x weekly - 3 sets - 10 reps  Treatment priorities   Eval 4/17 5/3       balance flexion       Direct quad strengthening RF       Flexion ROM gait       Eccentric control                 ASSESSMENT:  CLINICAL IMPRESSION: Pt was able to complete all prescribed exercises with no adverse effect. Continuing to work on knee flexion ROM following MUA with 113 degrees w/ OP today.  Encouraged pt to work on RF stretch at home as she is still only able to achieve about 90 degrees of flexion in this position.  She will start walking program at home.  EVAL: Brenda Mcneil is a 33 y.o. female who presents to clinic with signs and sxs consistent with expected L knee pain and stiffness following TKA on 2/24.  Her ext ROM looks very good.  She is quite limited in her flexion at  this point.  Pt will benefit from skilled therapy to address relevant deficits and return to active job.    OBJECTIVE IMPAIRMENTS: Pain, knee ROM, knee strength, gait, balance  ACTIVITY LIMITATIONS: walking, steps, squatting, bending, lifting, working  PERSONAL FACTORS: See medical history and pertinent history   REHAB POTENTIAL: Good  CLINICAL DECISION MAKING: Stable/uncomplicated  EVALUATION COMPLEXITY: Low   GOALS:   SHORT TERM GOALS: Target date: 04/26/2023   Brenda Mcneil will be >75% HEP compliant to improve carryover between sessions and facilitate independent management of condition  Evaluation: ongoing Goal status: MET Pt reports adherence 04/12/23   Brenda Mcneil TERM GOALS: Target date: 06/21/2023 extended to 07/24/2023    Brenda Mcneil will self report </= 3/10 pain during daily activities from evaluation to improve function in daily tasks  Evaluation/Baseline: 7/10 max pain 4/30: 3/10 before MUA on 4/28, after 7/10 5/2: 3/10 Goal status: Ongoing   2.  Brenda Mcneil will improve 10 meter max gait speed to 1.4 m/s (.1 m/s MCID) to show functional improvement in ambulation    Evaluation/Baseline: not tested 4/30: 1.1 m/s Goal status: Ongoing   Norms:     3.  Brenda Mcneil will improve 30'' STS (MCID 2) to >/= 12x (w/ UE?: N) to show improved LE strength and improved transfers   Evaluation/Baseline: not tested 4/30: 9x w/ UE support Goal status: Ongoing   4.  Brenda Mcneil will show a >/= 36 pt improvement in LEFS score (MCID is ~11% or 9 pts) as a proxy for functional improvement   Evaluation/Baseline: 13 pts 4/30: 59 pts Goal status: MET   5.  Brenda Mcneil will  achieve 115 degrees knee flexion to improve ability to complete transfers, squat, and navigate steps  Evaluation/Baseline: 20 degrees 04/12/23: 45 degrees - see above chart 4/29: 103 4/30: 105 s/p MUA 5/2: 110 w/ OP Goal status: Ongoing   6.  Brenda Mcneil will achieve 0 degrees knee extension to improve mechanics of gait  Evaluation/Baseline: lacking 4 degrees 4/30: lacking 2 degrees Goal status: Ongoing  7. Brenda Mcneil will be able to return to work, not limited by pain  Evaluation/Baseline: limited 4/30: still limited, needed MUA, but plans on returning after rehab is complete Goal status: Ongoing    PLAN: PT FREQUENCY: 1-5x/week  PT DURATION: 8 weeks  PLANNED INTERVENTIONS:  97164- PT Re-evaluation, 97110-Therapeutic exercises, 97530- Therapeutic activity, 97112- Neuromuscular re-education, 97535- Self Care, 54098- Manual therapy, [id number]- Gait training, [id number]- Aquatic Therapy, [phone]- Electrical stimulation (manual), [id number]- Vasopneumatic device, [id number]- Traction (mechanical), [id number]- Ionotophoresis 4mg /ml Dexamethasone , Taping, Dry Needling, Joint manipulation, and Spinal manipulation.   Donovan Gallant Yosselyn Tax PT 06/06/2023, 11:53 AM

## 2023-06-08 ENCOUNTER — Ambulatory Visit: Admitting: Physical Therapy

## 2023-06-08 ENCOUNTER — Encounter: Payer: Self-pay | Admitting: Physical Therapy

## 2023-06-08 DIAGNOSIS — M25562 Pain in left knee: Secondary | ICD-10-CM

## 2023-06-08 DIAGNOSIS — M6281 Muscle weakness (generalized): Secondary | ICD-10-CM

## 2023-06-08 DIAGNOSIS — R600 Localized edema: Secondary | ICD-10-CM

## 2023-06-08 DIAGNOSIS — R2681 Unsteadiness on feet: Secondary | ICD-10-CM

## 2023-06-08 NOTE — Therapy (Signed)
 OUTPATIENT PHYSICAL THERAPY DAILY NOTE  Patient Name: Brenda Mcneil MRN: [id number] DOB:04-26-1990, 33 y.o., female Today's Date: 06/08/2023   PT End of Session - 06/08/23 1144     Visit Number 21    Number of Visits 30    Date for PT Re-Evaluation 07/24/23    Authorization Type [place] [employer]    Authorization Time Period Approved 12 visits 04/24/23-06/04/23, new auth submitted 5/2, Approved 12 visits 06/04/23-07/16/23    Authorization - Number of Visits 27    PT Start Time 1145    PT Stop Time 1226    PT Time Calculation (min) 41 min              Past Medical History:  Diagnosis Date   Anemia    Biological false positive RPR test 03/02/2020   RPR reactive on 28 week labs on 02/24/20. With  Negative Tpal and titer 1:1, this is most likely false positive.  Repeat RPR in third trimester or when admitted to L&D. RPR NR at 37weeks.   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   Procedure: TOTAL KNEE ARTHROPLASTY;  Surgeon: Murleen Arms, MD;  Location: [facility];  Service: Orthopedics;  Laterality: Left;   Patient Active Problem List   Diagnosis Date Noted   Postpartum care following vaginal delivery 05/26/2020   Contraception management 05/26/2020    PCP: Versie Gores, FNP  REFERRING PROVIDER: Murleen Arms, MD  THERAPY DIAG:  Left knee pain, unspecified chronicity  Muscle weakness  Unsteadiness on feet  Localized edema  REFERRING DIAG: L TKA 2/24  Rationale for Evaluation and Treatment:  Rehabilitation  SUBJECTIVE:  PERTINENT PAST HISTORY:   Multiple past ACL tears        PRECAUTIONS: None  WEIGHT BEARING RESTRICTIONS No  FALLS:  Has patient fallen in last 6 months? No, Number of falls: 0  MOI/History of condition:  Onset date: 2/24  SUBJECTIVE STATEMENT Pt reports that her knee was very sore yesterday but is feeling a bit better today.  She has been doing the exercise bike at the gym.  EVAL: Brenda Mcneil is a 33 y.o. female who presents to clinic with chief complaint of L knee pain and stiffness following TKA on 2/24.  She has a history of ACL reconstruction, one in 2006 and one in 2010.  Both grafts failed which resulted in  arthritis, ultimately leading to L TKA on 2/24.   Red flags:  denies malaise, erythema / streaking, and chills / night sweats  Pain:  Are you having pain? 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,000 steps  Assistive Device: FWW, SPC, no AD at baseline  Hand Dominance: na  Patient Goals/Specific Activities: reduce pain, walking large amounts, lifting, stairs   OBJECTIVE:   DIAGNOSTIC FINDINGS:  na  GENERAL OBSERVATION/GAIT: Slow antalgic gait using FWW  SENSATION: Light touch: Appears intact  PALPATION: Expected edema  about L knee with mild lateral bruising at proximal tibia, dressing in place and clean  LE MMT:  MMT Right (Eval) Left (Eval)  Hip flexion (L2, L3) 4 3-*  Knee extension (L3) 4+ 3-*  Knee flexion 4+ 3-*  Hip abduction    Hip extension    Hip external rotation    Hip internal rotation    Hip adduction    Ankle dorsiflexion (L4)    Ankle plantarflexion (S1)    Ankle inversion    Ankle eversion    Great Toe ext (L5)    Grossly     (Blank rows = not tested, score listed is out of 5 possible points.  N = WNL, D = diminished, C = clear for  gross weakness with myotome testing, * = concordant pain with testing)  LE ROM:  ROM Right (Eval) Left (Eval) Left 3/4 Left 04/10/23 Left 04/12/23 L 3/18 L 3/25 L 3/27 L 4/1 L 4/1 L 4/15 L  L 5/1 L 5/2 L 5/7  Hip flexion                 Hip extension                 Hip abduction                 Hip adduction                 Hip internal rotation                 Hip external rotation                 Knee extension -4 Lacking 4 Lacking 2              Knee flexion 130 20 degrees 40 degrees passive 42 45 50 prior to manual 65 with pain and OP following manual 75 with OP 80 with significant OP 75 w/ op 78 w/ op 86 w/ OP 95 w/ OP 103 end of session w/ OP 95 w/ OP at start of session 108  with OP at end of session 110 w/ OP 113 w/ OP  Ankle dorsiflexion                 Ankle plantarflexion                 Ankle inversion                 Ankle eversion                  (Blank rows = not tested, N = WNL, * = concordant pain with testing)  Functional Tests  Eval                                                              PATIENT SURVEYS:  LEFS: 13/80   TODAY'S TREATMENT:   [facility] Adult PT Treatment:                                                DATE: 06/08/2023 Therapeutic Exercise: Bike - 5 min for ROM RF  stretch in prone GS stretch - 1' x2 Bridge on ball with HS curl  Therapeutic Activity: 2 up 1 down heel raises - 2x10 Triple ext at wall - 2x10 Step up 6'' step - 2x10 fd - 10x lat Fwd step over fwd and retro - 2x10   HOME EXERCISE PROGRAM: Access Code: [email or web address] URL: [email or web address] Date: 06/08/2023 Prepared by: Lesleigh Rash  Exercises - Prone Quadriceps Stretch with Strap  - 2 x daily - 7 x weekly - 1 sets - 3 reps - 45 second hold - Lunge with Counter Support  - 5 x daily - 7 x weekly - 2 sets - 10 reps - Single Leg Stance  - 2 x daily - 6 x weekly - 1 sets - 3 reps - 30-60 sec hold - Seated Knee Extension with  Resistance  - 1 x daily - 7 x weekly - 3 sets - 10 reps - Seated Hamstring Curl with Anchored Resistance  - 1 x daily - 7 x weekly - 3 sets - 10 reps  Treatment priorities   Eval 4/17 5/3       balance flexion       Direct quad strengthening RF       Flexion ROM gait       Eccentric control                 ASSESSMENT:  CLINICAL IMPRESSION: Pt was able to complete all prescribed exercises with no adverse effect. Continuing to maintain flexion ROM while integrating functional movements and normalizing gait.  L HS shows significant strength deficits; suggested she begin HS curls at home. Rec fem continues to limit prone ROM to ~90 degrees; she is working on this at home.  EVAL: Brenda Mcneil is a 33 y.o. female who presents to clinic with signs and sxs consistent with expected L knee pain and stiffness following TKA on 2/24.  Her ext ROM looks very good.  She is quite limited in her flexion at  this point.  Pt will benefit from skilled therapy to address relevant deficits and return to active job.    OBJECTIVE IMPAIRMENTS: Pain, knee ROM, knee strength, gait, balance  ACTIVITY LIMITATIONS: walking, steps, squatting, bending, lifting, working  PERSONAL FACTORS: See medical history and pertinent history   REHAB POTENTIAL: Good  CLINICAL DECISION MAKING: Stable/uncomplicated  EVALUATION COMPLEXITY: Low   GOALS:   SHORT TERM GOALS: Target date: 04/26/2023   Brenda Mcneil will be >75% HEP compliant to improve carryover between sessions and facilitate independent management of condition  Evaluation: ongoing Goal status: MET Pt reports adherence 04/12/23   Brenda Mcneil TERM GOALS: Target date: 06/21/2023 extended to 07/24/2023    Brenda Mcneil will self report </= 3/10 pain during daily activities from evaluation to improve function in daily tasks  Evaluation/Baseline: 7/10 max pain 4/30: 3/10 before MUA on 4/28, after 7/10 5/2: 3/10 Goal status: Ongoing   2.  Brenda Mcneil will improve 10 meter max  gait speed to 1.4 m/s (.1 m/s MCID) to show functional improvement in ambulation   Evaluation/Baseline: not tested 4/30: 1.1 m/s Goal status: Ongoing   Norms:     3.  Brenda Mcneil will improve 30'' STS (MCID 2) to >/= 12x (w/ UE?: N) to show improved LE strength and improved transfers   Evaluation/Baseline: not tested 4/30: 9x w/ UE support Goal status: Ongoing   4.  Brenda Mcneil will show a >/= 36 pt improvement in LEFS score (MCID is ~11% or 9 pts) as a proxy for functional improvement  Evaluation/Baseline: 13 pts 4/30: 59 pts Goal status: MET   5.  Brenda Mcneil will achieve 115 degrees knee flexion to improve ability to complete transfers, squat, and navigate steps  Evaluation/Baseline: 20 degrees 04/12/23: 45 degrees - see above chart 4/29: 103 4/30: 105 s/p MUA 5/2: 110 w/ OP Goal status: Ongoing   6.  Brenda Mcneil will achieve 0 degrees knee extension to improve mechanics of gait  Evaluation/Baseline: lacking 4 degrees 4/30: lacking 2 degrees Goal status: Ongoing  7. Brenda Mcneil will be able to return to work, not limited by pain  Evaluation/Baseline: limited 4/30: still limited, needed MUA, but plans on returning after rehab is complete Goal status: Ongoing    PLAN: PT FREQUENCY: 1-5x/week  PT DURATION: 8 weeks  PLANNED INTERVENTIONS:  97164- PT Re-evaluation, 97110-Therapeutic exercises, 97530- Therapeutic activity, 97112- Neuromuscular re-education, 97535- Self Care, 98119- Manual therapy, [id number]- Gait training, [id number]- Aquatic Therapy, [phone]- Electrical stimulation (manual), [id number]- Vasopneumatic device, [id number]- Traction (mechanical), [id number]- Ionotophoresis 4mg /ml Dexamethasone , Taping, Dry Needling, Joint manipulation, and Spinal manipulation.   Donovan Gallant Brenda Mcneil PT 06/08/2023, 12:34 PM

## 2023-06-14 NOTE — Therapy (Signed)
 OUTPATIENT PHYSICAL THERAPY DAILY NOTE  Patient Name: Jovan Bona MRN: [id number] DOB:04-18-1990, 33 y.o., female Today's Date: 06/15/2023   PT End of Session - 06/15/23 1041     Visit Number 22    Number of Visits 30    Date for PT Re-Evaluation 07/24/23    Authorization Type [place] [employer]    Authorization Time Period Approved 12 visits 06/04/23-07/16/23    Authorization - Visit Number 3    Authorization - Number of Visits 12    PT Start Time 1042    PT Stop Time 1125    PT Time Calculation (min) 43 min    Activity Tolerance Patient tolerated treatment well               Past Medical History:  Diagnosis Date   Anemia    Biological false positive RPR test 03/02/2020   RPR reactive on 28 week labs on 02/24/20. With  Negative Tpal and titer 1:1, this is most likely false positive.  Repeat RPR in third trimester or when admitted to L&D. RPR NR at 37weeks.   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   Procedure: TOTAL KNEE ARTHROPLASTY;  Surgeon: Murleen Arms, MD;  Location: [facility];  Service: Orthopedics;  Laterality: Left;   Patient Active Problem List   Diagnosis Date Noted   Postpartum care following vaginal delivery 05/26/2020   Contraception management 05/26/2020    PCP: Versie Gores, FNP  REFERRING PROVIDER: Jesusa Moros*  THERAPY DIAG:  Left knee pain, unspecified chronicity  Muscle weakness  Unsteadiness on feet  Localized edema  REFERRING DIAG: L TKA 2/24  Rationale for Evaluation and Treatment:   Rehabilitation  SUBJECTIVE:  PERTINENT PAST HISTORY:  Multiple past ACL tears        PRECAUTIONS: None  WEIGHT BEARING RESTRICTIONS No  FALLS:  Has patient fallen in last 6 months? No, Number of falls: 0  MOI/History of condition:  Onset date: 2/24  SUBJECTIVE STATEMENT 06/15/2023 went to gym yesterday and did bike , felt a bit sore after. Feels mobility is improving. States surgical followup went well earlier this week, sees them again in 6 weeks.   EVAL: Sylar Jansen is a 33 y.o. female who presents to clinic with chief complaint of L knee pain and stiffness following TKA on 2/24.  She has a history of ACL reconstruction, one in 2006 and one in 2010.  Both grafts failed which resulted in  arthritis, ultimately leading to L TKA on 2/24.   Red flags:  denies malaise, erythema / streaking, and chills / night sweats  Pain:  Are you having pain? 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,000 steps  Assistive Device: FWW, SPC, no AD at baseline  Hand Dominance: na  Patient Goals/Specific Activities: reduce pain, walking large amounts, lifting, stairs   OBJECTIVE:   DIAGNOSTIC FINDINGS:  na  GENERAL OBSERVATION/GAIT:  Slow antalgic gait using FWW  SENSATION: Light touch: Appears intact  PALPATION: Expected edema about L knee with mild lateral bruising at proximal tibia, dressing in place and clean  LE MMT:  MMT Right (Eval) Left (Eval)  Hip flexion (L2, L3) 4 3-*  Knee extension (L3) 4+ 3-*  Knee flexion 4+ 3-*  Hip abduction    Hip extension    Hip external rotation    Hip internal rotation    Hip adduction    Ankle dorsiflexion (L4)    Ankle plantarflexion (S1)    Ankle inversion    Ankle eversion    Great Toe ext (L5)    Grossly      (Blank rows = not tested, score listed is out of 5 possible points.  N = WNL, D = diminished, C = clear for gross weakness with myotome testing, * = concordant pain with testing)  LE ROM:  ROM Right (Eval) Left (Eval) Left 3/4 Left 04/10/23 Left 04/12/23 L 3/18 L 3/25 L 3/27 L 4/1 L 4/1 L 4/15 L  L 5/1 L 5/2 L 5/7  Hip flexion                 Hip extension                 Hip abduction                 Hip adduction                 Hip internal rotation                 Hip external rotation                 Knee extension -4 Lacking 4 Lacking 2              Knee flexion 130 20 degrees 40 degrees passive 42 45 50 prior to manual 65 with pain and OP following manual 75 with OP 80 with significant OP 75 w/ op 78 w/ op 86 w/ OP 95 w/ OP 103 end of session w/ OP 95 w/ OP at start of session 108  with OP at end of session 110 w/ OP 113 w/ OP  Ankle dorsiflexion                 Ankle plantarflexion                 Ankle inversion                 Ankle eversion                  (Blank rows = not tested, N = WNL, * = concordant pain with testing)  Functional Tests  Eval                                                              PATIENT SURVEYS:  LEFS: 13/80   TODAY'S TREATMENT:  [facility] Adult PT Treatment:  DATE: 06/15/23 Therapeutic Exercise: Recumbent bike ROM during subjective HS curl machine LLE only 10# 2x12, 15# x8 Lunge onto 6 inch step LLE for ROM x12 Standing hamstring curl 2# on 4 inch step for clearance 2x8 Prone RF stretch w/ strap x30sec Prone RF stretch + 3-5sec iso ext for inc RF flexibility 2x3  Therapeutic Activity: Squat tap to table, 2x8 (mirror on second set for WB symmetry) Squat tap to table 10# x8 6 inch step up LLE 2x8 (mild instability initially with 1 LOB, pt able to self recover)    HOME EXERCISE PROGRAM: Access Code: [email or web address] URL:  [email or web address] Date: 06/08/2023 Prepared by: Lesleigh Rash  Exercises - Prone Quadriceps Stretch with Strap  - 2 x daily - 7 x weekly - 1 sets - 3 reps - 45 second hold - Lunge with Counter Support  - 5 x daily - 7 x weekly - 2 sets - 10 reps - Single Leg Stance  - 2 x daily - 6 x weekly - 1 sets - 3 reps - 30-60 sec hold - Seated Knee Extension with Resistance  - 1 x daily - 7 x weekly - 3 sets - 10 reps - Seated Hamstring Curl with Anchored Resistance  - 1 x daily - 7 x weekly - 3 sets - 10 reps  Treatment priorities   Eval 4/17 5/3       balance flexion       Direct quad strengthening RF       Flexion ROM gait       Eccentric control                 ASSESSMENT:  CLINICAL IMPRESSION: Pt arrives w/ min pain, continues to endorse stiffness. Today focusing on improving WB symmetry and functional strength, as well as improving knee flexion mobility in hip flexed/neutral positions. Good improvement in flexion tolerance w/ addition of ext iso hold during prone RF strength. Able to passively get to 116 deg knee flex in supine limited by transient pain. No adverse events, pt reports improved symptoms on departure. Recommend continuing along current POC in order to address relevant deficits and improve functional tolerance. Pt departs today's session in no acute distress, all voiced questions/concerns addressed appropriately from PT perspective.     EVAL: Cyntha is a 33 y.o. female who presents to clinic with signs and sxs consistent with expected L knee pain and stiffness following TKA on 2/24.  Her ext ROM looks very good.  She is quite limited in her flexion at  this point.  Pt will benefit from skilled therapy to address relevant deficits and return to active job.    OBJECTIVE IMPAIRMENTS: Pain, knee ROM, knee strength, gait, balance  ACTIVITY LIMITATIONS: walking, steps, squatting, bending, lifting, working  PERSONAL FACTORS: See medical history and pertinent  history   REHAB POTENTIAL: Good  CLINICAL DECISION MAKING: Stable/uncomplicated  EVALUATION COMPLEXITY: Low   GOALS:   SHORT TERM GOALS: Target date: 04/26/2023   Joretha will be >75% HEP compliant to improve carryover between sessions and facilitate independent management of condition  Evaluation: ongoing Goal status: MET Pt reports adherence 04/12/23   Mccaul TERM GOALS: Target date: 06/21/2023 extended to 07/24/2023    Ceylin will self report </= 3/10 pain during daily activities from evaluation to improve function in daily tasks  Evaluation/Baseline: 7/10 max pain 4/30: 3/10 before MUA on 4/28, after 7/10 5/2: 3/10 Goal status: Ongoing   2.  Maricia will improve 10 meter max gait  speed to 1.4 m/s (.1 m/s MCID) to show functional improvement in ambulation   Evaluation/Baseline: not tested 4/30: 1.1 m/s Goal status: Ongoing   Norms:     3.  Ahleena will improve 30'' STS (MCID 2) to >/= 12x (w/ UE?: N) to show improved LE strength and improved transfers   Evaluation/Baseline: not tested 4/30: 9x w/ UE support Goal status: Ongoing   4.  Catrece will show a >/= 36 pt improvement in LEFS score (MCID is ~11% or 9 pts) as a proxy for functional improvement   Evaluation/Baseline: 13 pts 4/30: 59 pts Goal status: MET   5.  Leamsi will achieve 115 degrees knee flexion to improve ability to complete transfers, squat, and navigate steps  Evaluation/Baseline: 20 degrees 04/12/23: 45 degrees - see above chart 4/29: 103 4/30: 105 s/p MUA 5/2: 110 w/ OP Goal status: Ongoing   6.  Shaquandra will achieve 0 degrees knee extension to improve mechanics of gait  Evaluation/Baseline: lacking 4 degrees 4/30: lacking 2 degrees Goal status: Ongoing  7. Terrisha will be able to return to work, not limited by pain  Evaluation/Baseline: limited 4/30: still limited, needed MUA, but plans on returning after rehab is complete Goal status: Ongoing    PLAN: PT FREQUENCY:  1-5x/week  PT DURATION: 8 weeks  PLANNED INTERVENTIONS:  97164- PT Re-evaluation, 97110-Therapeutic exercises, 97530- Therapeutic activity, 97112- Neuromuscular re-education, 97535- Self Care, 16109- Manual therapy, [id number]- Gait training, [id number]- Aquatic Therapy, [phone]- Electrical stimulation (manual), [id number]- Vasopneumatic device, [id number]- Traction (mechanical), [id number]- Ionotophoresis 4mg /ml Dexamethasone , Taping, Dry Needling, Joint manipulation, and Spinal manipulation.   Lovett Ruck PT, DPT 06/15/2023 11:33 AM

## 2023-06-15 ENCOUNTER — Encounter: Payer: Self-pay | Admitting: Physical Therapy

## 2023-06-15 ENCOUNTER — Ambulatory Visit: Payer: Self-pay | Admitting: Physical Therapy

## 2023-06-15 DIAGNOSIS — M6281 Muscle weakness (generalized): Secondary | ICD-10-CM

## 2023-06-15 DIAGNOSIS — R2681 Unsteadiness on feet: Secondary | ICD-10-CM

## 2023-06-15 DIAGNOSIS — R600 Localized edema: Secondary | ICD-10-CM

## 2023-06-15 DIAGNOSIS — M25562 Pain in left knee: Secondary | ICD-10-CM

## 2023-06-19 ENCOUNTER — Encounter: Payer: Self-pay | Admitting: Physical Therapy

## 2023-06-19 ENCOUNTER — Ambulatory Visit: Payer: Self-pay | Admitting: Physical Therapy

## 2023-06-19 DIAGNOSIS — M25562 Pain in left knee: Secondary | ICD-10-CM

## 2023-06-19 DIAGNOSIS — R600 Localized edema: Secondary | ICD-10-CM

## 2023-06-19 DIAGNOSIS — R2681 Unsteadiness on feet: Secondary | ICD-10-CM

## 2023-06-19 DIAGNOSIS — M6281 Muscle weakness (generalized): Secondary | ICD-10-CM

## 2023-06-19 NOTE — Therapy (Signed)
 OUTPATIENT PHYSICAL THERAPY DAILY NOTE  Patient Name: Brenda Mcneil MRN: [id number] DOB:[birth year], 33 y.o., female Today's Date: 06/19/2023   PT End of Session - 06/19/23 1416     Visit Number 23    Number of Visits 30    Date for PT Re-Evaluation 07/24/23    Authorization Type [place] [facility]    Authorization Time Period Approved 12 visits 06/04/23-07/16/23    Authorization - Visit Number 4    Authorization - Number of Visits 12    Activity Tolerance Patient tolerated treatment well               Past Medical History:  Diagnosis Date   Anemia    Biological false positive RPR test 03/02/2020   RPR reactive on 28 week labs on 02/24/20. With  Negative Tpal and titer 1:1, this is most likely false positive.  Repeat RPR in third trimester or when admitted to L&D. RPR NR at 37weeks.   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   Procedure: TOTAL KNEE ARTHROPLASTY;  Surgeon: Murleen Arms, MD;  Location: [facility];  Service: Orthopedics;  Laterality: Left;   Patient Active Problem List   Diagnosis Date Noted   Postpartum care following vaginal delivery 05/26/2020   Contraception management 05/26/2020    PCP: Versie Gores, FNP  REFERRING PROVIDER: Murleen Arms, MD  THERAPY DIAG:  Left knee pain, unspecified chronicity  Muscle weakness  Unsteadiness on feet  Localized edema  REFERRING DIAG: L TKA 2/24  Rationale for Evaluation and Treatment:  Rehabilitation  SUBJECTIVE:  PERTINENT PAST HISTORY:  Multiple past ACL tears        PRECAUTIONS:  None  WEIGHT BEARING RESTRICTIONS No  FALLS:  Has patient fallen in last 6 months? No, Number of falls: 0  MOI/History of condition:  Onset date: 2/24  SUBJECTIVE STATEMENT 06/19/2023 Pt reports she is confident in wrapping up next visit.  She returns to work on Sunday.   EVAL: Brenda Mcneil is a 33 y.o. female who presents to clinic with chief complaint of L knee pain and stiffness following TKA on 2/24.  She has a history of ACL reconstruction, one in 2006 and one in 2010.  Both grafts failed which resulted in  arthritis, ultimately leading to L TKA on 2/24.   Red flags:  denies malaise, erythema / streaking, and chills / night sweats  Pain:  Are you having pain? 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,000 steps  Assistive Device: FWW, SPC, no AD at baseline  Hand Dominance: na  Patient Goals/Specific Activities: reduce pain, walking large amounts, lifting, stairs   OBJECTIVE:   DIAGNOSTIC FINDINGS:  na  GENERAL OBSERVATION/GAIT: Slow antalgic gait using FWW  SENSATION: Light touch: Appears intact  PALPATION: Expected edema about L knee with mild lateral bruising at proximal tibia, dressing in place and clean  LE MMT:  MMT Right (  Eval) Left (Eval)  Hip flexion (L2, L3) 4 3-*  Knee extension (L3) 4+ 3-*  Knee flexion 4+ 3-*  Hip abduction    Hip extension    Hip external rotation    Hip internal rotation    Hip adduction    Ankle dorsiflexion (L4)    Ankle plantarflexion (S1)    Ankle inversion    Ankle eversion    Great Toe ext (L5)    Grossly     (Blank rows = not tested, score listed is out of 5 possible points.  N = WNL, D = diminished, C = clear for gross weakness with myotome testing, * = concordant pain with testing)  LE  ROM:  ROM Right (Eval) Left (Eval) Left 3/4 Left 04/10/23 Left 04/12/23 L 3/18 L 3/25 L 3/27 L 4/1 L 4/1 L 4/15 L  L 5/1 L 5/2 L 5/7  Hip flexion                 Hip extension                 Hip abduction                 Hip adduction                 Hip internal rotation                 Hip external rotation                 Knee extension -4 Lacking 4 Lacking 2              Knee flexion 130 20 degrees 40 degrees passive 42 45 50 prior to manual 65 with pain and OP following manual 75 with OP 80 with significant OP 75 w/ op 78 w/ op 86 w/ OP 95 w/ OP 103 end of session w/ OP 95 w/ OP at start of session 108  with OP at end of session 110 w/ OP 113 w/ OP  Ankle dorsiflexion                 Ankle plantarflexion                 Ankle inversion                 Ankle eversion                  (Blank rows = not tested, N = WNL, * = concordant pain with testing)  Functional Tests  Eval                                                              PATIENT SURVEYS:  LEFS: 13/80   TODAY'S TREATMENT:  [facility] Adult PT Treatment:                                                DATE: 06/19/23 Therapeutic Exercise: Recumbent bike ROM during subjective HS curl machine LLE only 10# 2x12, 15# x8 (not today) Lunge onto 6 inch step LLE for ROM x12 Standing hamstring curl  2# on 4 inch step for clearance 2x8 Prone RF stretch + 3-5sec iso ext for inc RF flexibility 2x3  Therapeutic Activity: Lateral heel taps - 4'' step - 5x5 Squat tap to table, 3x8 (mirror on second set for WB symmetry) Squat tap to table 10# 2x8 6 inch step up LLE 3x8    HOME EXERCISE PROGRAM: Access Code: [email or web address] URL: [email or web address] Date: 06/08/2023 Prepared by: Lesleigh Rash  Exercises - Prone Quadriceps Stretch with Strap  - 2 x daily - 7 x weekly - 1 sets - 3 reps - 45 second hold - Lunge with Counter Support  - 5 x daily - 7 x weekly - 2 sets - 10 reps -  Single Leg Stance  - 2 x daily - 6 x weekly - 1 sets - 3 reps - 30-60 sec hold - Seated Knee Extension with Resistance  - 1 x daily - 7 x weekly - 3 sets - 10 reps - Seated Hamstring Curl with Anchored Resistance  - 1 x daily - 7 x weekly - 3 sets - 10 reps  Treatment priorities   Eval 4/17 5/3       balance flexion       Direct quad strengthening RF       Flexion ROM gait       Eccentric control                 ASSESSMENT:  CLINICAL IMPRESSION: Grace tolerated session well with no adverse reaction.  Emphasized eccentric quad control and symmetrical squatting to good effect.  Pt reports significant local muscular fatigue with step down  but is able to complete with good form from 4'' step.  Continues to be limited in prone knee flexion and is working on this at home.  [place] D/C next visit.   EVAL: Tiffanny is a 33 y.o. female who presents to clinic with signs and sxs consistent with expected L knee pain and stiffness following TKA on 2/24.  Her ext ROM looks very good.  She is quite limited in her flexion at  this point.  Pt will benefit from skilled therapy to address relevant deficits and return to active job.    OBJECTIVE IMPAIRMENTS: Pain, knee ROM, knee strength, gait, balance  ACTIVITY LIMITATIONS: walking, steps, squatting, bending, lifting, working  PERSONAL FACTORS: See medical history and pertinent history   REHAB POTENTIAL: Good  CLINICAL DECISION MAKING: Stable/uncomplicated  EVALUATION COMPLEXITY: Low   GOALS:   SHORT TERM GOALS: Target date: 04/26/2023   Velecia will be >75% HEP compliant to improve carryover between sessions and facilitate independent management of condition  Evaluation: ongoing Goal status: MET Pt reports adherence 04/12/23   [place] TERM GOALS: Target date: 06/21/2023 extended to 07/24/2023    Katria will self report </= 3/10 pain during daily activities from evaluation to improve function in daily tasks  Evaluation/Baseline: 7/10  max pain 4/30: 3/10 before MUA on 4/28, after 7/10 5/2: 3/10 Goal status: Ongoing   2.  Bonnye will improve 10 meter max gait speed to 1.4 m/s (.1 m/s MCID) to show functional improvement in ambulation   Evaluation/Baseline: not tested 4/30: 1.1 m/s Goal status: Ongoing   Norms:     3.  Cieanna will improve 30'' STS (MCID 2) to >/= 12x (w/ UE?: N) to show improved LE strength and improved transfers   Evaluation/Baseline: not tested 4/30: 9x w/ UE support Goal status: Ongoing   4.  Anwen will show a >/= 36 pt improvement  in LEFS score (MCID is ~11% or 9 pts) as a proxy for functional improvement   Evaluation/Baseline: 13 pts 4/30: 59 pts Goal status: MET   5.  Sharnita will achieve 115 degrees knee flexion to improve ability to complete transfers, squat, and navigate steps  Evaluation/Baseline: 20 degrees 04/12/23: 45 degrees - see above chart 4/29: 103 4/30: 105 s/p MUA 5/2: 110 w/ OP Goal status: Ongoing   6.  Mei will achieve 0 degrees knee extension to improve mechanics of gait  Evaluation/Baseline: lacking 4 degrees 4/30: lacking 2 degrees Goal status: Ongoing  7. Cloee will be able to return to work, not limited by pain  Evaluation/Baseline: limited 4/30: still limited, needed MUA, but plans on returning after rehab is complete Goal status: Ongoing    PLAN: PT FREQUENCY: 1-5x/week  PT DURATION: 8 weeks  PLANNED INTERVENTIONS:  97164- PT Re-evaluation, 97110-Therapeutic exercises, 97530- Therapeutic activity, 97112- Neuromuscular re-education, 97535- Self Care, 16109- Manual therapy, [id number]- Gait training, [id number]- Aquatic Therapy, [phone]- Electrical stimulation (manual), [id number]- Vasopneumatic device, [id number]- Traction (mechanical), [id number]- Ionotophoresis 4mg /ml Dexamethasone , Taping, Dry Needling, Joint manipulation, and Spinal manipulation.   Bree Heinzelman E Wasif Simonich PT 06/19/2023 3:00 PM

## 2023-06-21 ENCOUNTER — Ambulatory Visit: Admitting: Physical Therapy

## 2023-06-21 ENCOUNTER — Encounter: Payer: Self-pay | Admitting: Physical Therapy

## 2023-06-21 DIAGNOSIS — R2681 Unsteadiness on feet: Secondary | ICD-10-CM

## 2023-06-21 DIAGNOSIS — M25562 Pain in left knee: Secondary | ICD-10-CM

## 2023-06-21 DIAGNOSIS — M6281 Muscle weakness (generalized): Secondary | ICD-10-CM

## 2023-06-21 DIAGNOSIS — R600 Localized edema: Secondary | ICD-10-CM

## 2023-06-21 NOTE — Therapy (Signed)
 PHYSICAL THERAPY DISCHARGE SUMMARY  Visits from Start of Care: 24  Current functional level related to goals / functional outcomes: See assessment/goals   Remaining deficits: See assessment/goals   Education / Equipment: HEP and D/C plans  Patient agrees to discharge. Patient goals were met. Patient is being discharged due to being pleased with the current functional level.  Patient Name: Brenda Mcneil MRN: [id number] DOB:1990-10-02, 33 y.o., female Today's Date: 06/21/2023   PT End of Session - 06/21/23 1057     Visit Number 24    Number of Visits 30    Date for PT Re-Evaluation 07/24/23    Authorization Type [place] MCD    Authorization Time Period Approved 12 visits 06/04/23-07/16/23    Authorization - Number of Visits 12    PT Start Time 1100    PT Stop Time 1142    PT Time Calculation (min) 42 min    Activity Tolerance Patient tolerated treatment well               Past Medical History:  Diagnosis Date   Anemia    Biological false positive RPR test 03/02/2020   RPR reactive on 28 week labs on 02/24/20. With  Negative Tpal and titer 1:1, this is most likely false positive.  Repeat RPR in third trimester or when admitted to L&D. RPR NR at 37weeks.   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   Procedure: TOTAL KNEE ARTHROPLASTY;  Surgeon: Murleen Arms, MD;  Location: [facility];  Service: Orthopedics;  Laterality: Left;   Patient Active Problem List   Diagnosis Date Noted   Postpartum care following vaginal delivery 05/26/2020   Contraception management  05/26/2020    PCP: Versie Gores, FNP  REFERRING PROVIDER: Murleen Arms, MD  THERAPY DIAG:  Left knee pain, unspecified chronicity  Muscle weakness  Unsteadiness on feet  Localized edema  REFERRING DIAG: L TKA 2/24  Rationale for Evaluation and Treatment:  Rehabilitation  SUBJECTIVE:  PERTINENT PAST HISTORY:  Multiple past ACL tears        PRECAUTIONS: None  WEIGHT BEARING RESTRICTIONS No  FALLS:  Has patient fallen in last 6 months? No, Number of falls: 0  MOI/History of condition:  Onset date: 2/24  SUBJECTIVE STATEMENT 06/21/2023 Pt reports that she is feeling good and is prepared for D/C.   EVAL: Brenda Mcneil is a 32 y.o. female who presents to clinic with chief complaint of L knee pain and stiffness following TKA on 2/24.  She has a history of ACL reconstruction, one in 2006 and one in 2010.  Both grafts failed which resulted in  arthritis, ultimately leading to L TKA on 2/24.   Red flags:  denies malaise, erythema / streaking, and chills / night sweats  Pain:  Are you having pain? 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,000 steps  Assistive Device: FWW, SPC, no  AD at baseline  Hand Dominance: na  Patient Goals/Specific Activities: reduce pain, walking large amounts, lifting, stairs   OBJECTIVE:   DIAGNOSTIC FINDINGS:  na  GENERAL OBSERVATION/GAIT: Slow antalgic gait using FWW  SENSATION: Light touch: Appears intact  PALPATION: Expected edema about L knee with mild lateral bruising at proximal tibia, dressing in place and clean  LE MMT:  MMT Right (Eval) Left (Eval)  Hip flexion (L2, L3) 4 3-*  Knee extension (L3) 4+ 3-*  Knee flexion 4+ 3-*  Hip abduction    Hip extension    Hip external  rotation    Hip internal rotation    Hip adduction    Ankle dorsiflexion (L4)    Ankle plantarflexion (S1)    Ankle inversion    Ankle eversion    Great Toe ext (L5)    Grossly     (Blank rows = not tested, score listed is out of 5 possible points.  N = WNL, D = diminished, C = clear for gross weakness with myotome testing, * = concordant pain with testing)  LE ROM:  ROM Right (Eval) Left (Eval) Left 3/4 Left 04/10/23 Left 04/12/23 L 3/18 L 3/25 L 3/27 L 4/1 L 4/1 L 4/15 L  L 5/1 L 5/2 L 5/7  Hip flexion                 Hip extension                 Hip abduction                 Hip adduction                 Hip internal rotation                 Hip external rotation                 Knee extension -4 Lacking 4 Lacking 2              Knee flexion 130 20 degrees 40 degrees passive 42 45 50 prior to manual 65 with pain and OP following manual 75 with OP 80 with significant OP 75 w/ op 78 w/ op 86 w/ OP 95 w/ OP 103 end of session w/ OP 95 w/ OP at start of session 108  with OP at end of session 110 w/ OP 113 w/ OP  Ankle dorsiflexion                 Ankle plantarflexion                 Ankle inversion                 Ankle eversion                  (Blank rows = not tested, N = WNL, * = concordant pain with testing)  Functional Tests  Eval                                                              PATIENT SURVEYS:  LEFS: 13/80   TODAY'S TREATMENT:  [facility] Adult PT Treatment:  DATE: 06/21/23 Therapeutic Exercise: Recumbent bike ROM during subjective HS curl machine bil 2x10 @ 25#, LLE 10# 2x10 Knee ext machine - 3x10 - 5# Cybex leg press - 3x10 # 60 lbs Prone RF stretch + 3-5sec iso ext for inc RF flexibility 2x3  Therapeutic Activity: Lateral heel taps - 4'' step - 5x5 Checking goals, updating, and reviewing with patient    HOME EXERCISE PROGRAM: Access Code: [email or web address] URL:  [email or web address] Date: 06/21/2023 Prepared by: Lesleigh Rash  Exercises - Prone Quadriceps Stretch with Strap  - 2 x daily - 3-4 x weekly - 1 sets - 3 reps - 45 second hold - Lunge with Counter Support  - 5 x daily - 3-4 x weekly - 2 sets - 10 reps - Single Leg Knee Extension with Weight Machine  - 1 x daily - 3-4 x weekly - 3 sets - 10 reps - Single Leg Hamstring Curl with Weight Machine  - 1 x daily - 3-4 x weekly - 3 sets - 10 reps - Single Leg Press  - 1 x daily - 3-4 x weekly - 3 sets - 10 reps - Lateral Step Down  - 1 x daily - 3-4 x weekly - 3 sets - 10 reps  Treatment priorities   Eval 4/17 5/3       balance flexion       Direct quad strengthening RF       Flexion ROM gait       Eccentric control                 ASSESSMENT:  CLINICAL IMPRESSION: Zailyn tolerated session well with no adverse reaction.  Cambryn has met all goals and and is prepared for D/C home with HEP.  All questions answered to pts satisfaction from PT perspective.  HEP updated.   EVAL: Akansha is a 33 y.o. female who presents to clinic with signs and sxs consistent with expected L knee pain and stiffness following TKA on 2/24.  Her ext ROM looks very good.  She is quite limited in her flexion at  this point.  Pt will benefit from skilled therapy to address relevant deficits and return to active job.    OBJECTIVE IMPAIRMENTS: Pain, knee ROM, knee strength, gait, balance  ACTIVITY LIMITATIONS: walking, steps, squatting, bending, lifting, working  PERSONAL FACTORS: See medical history and pertinent history   REHAB POTENTIAL: Good  CLINICAL DECISION MAKING: Stable/uncomplicated  EVALUATION COMPLEXITY: Low   GOALS:   SHORT TERM GOALS: Target date: 04/26/2023   Vietta will be >75% HEP compliant to improve carryover between sessions and facilitate independent management of condition  Evaluation: ongoing Goal status: MET Pt reports adherence 04/12/23   [place] TERM GOALS:  Target date: 06/21/2023 extended to 07/24/2023    Shawana will self report </= 3/10 pain during daily activities from evaluation to improve function in daily tasks  Evaluation/Baseline: 7/10 max pain 4/30: 3/10 before MUA on 4/28, after 7/10 5/2: 3/10 Goal status: MET   2.  Courtenay will improve 10 meter max gait speed to 1.4 m/s (.1 m/s MCID) to show functional improvement in ambulation   Evaluation/Baseline: not tested 4/30: 1.1 m/s 5/22: 1.43 m/s Goal status: MET   Norms:     3.  Babbie will improve 30'' STS (MCID 2) to >/= 12x (w/ UE?: N) to show improved LE strength and improved transfers   Evaluation/Baseline: not tested 4/30: 9x w/ UE support 5/22: 14x w/o UE support Goal status: MET  4.  Felix will show a >/= 36 pt improvement in LEFS score (MCID is ~11% or 9 pts) as a proxy for functional improvement   Evaluation/Baseline: 13 pts 4/30: 59 pts 5/22: 76 pts Goal status: MET   5.  Alizeh will achieve 115 degrees knee flexion to improve ability to complete transfers, squat, and navigate steps  Evaluation/Baseline: 20 degrees 04/12/23: 45 degrees - see above chart 4/29: 103 4/30: 105 s/p MUA 5/2: 110 w/ OP 5/22: 115 w/ op Goal status: MET   6.  Layal will achieve 0 degrees knee extension to improve mechanics of gait  Evaluation/Baseline: lacking 4 degrees 4/30: lacking 2 degrees 5/22: MET Goal status: MET  7. Brucha will be able to return to work, not limited by pain  Evaluation/Baseline: limited 4/30: still limited, needed MUA, but plans on returning after rehab is complete 5/22: MET Goal status: MET    PLAN: PT FREQUENCY: 1-5x/week  PT DURATION: 8 weeks  PLANNED INTERVENTIONS:  97164- PT Re-evaluation, 97110-Therapeutic exercises, 97530- Therapeutic activity, 97112- Neuromuscular re-education, 97535- Self Care, 16109- Manual therapy, [id number]- Gait training, [id number]- Aquatic Therapy, [phone]- Electrical stimulation (manual), [id number]-  Vasopneumatic device, [id number]- Traction (mechanical), [id number]- Ionotophoresis 4mg /ml Dexamethasone , Taping, Dry Needling, Joint manipulation, and Spinal manipulation.   Donovan Gallant Jeslynn Hollander PT 06/21/2023 12:29 PM

## 2023-10-03 ENCOUNTER — Other Ambulatory Visit: Payer: Self-pay | Admitting: Medical Genetics

## 2023-10-05 ENCOUNTER — Other Ambulatory Visit
